# Patient Record
Sex: Female | Born: 1975
Health system: Midwestern US, Community
[De-identification: ages and names within clinical notes are randomized; demographics above are authoritative.]

## PROBLEM LIST (undated history)

## (undated) DIAGNOSIS — N200 Calculus of kidney: Secondary | ICD-10-CM

## (undated) DIAGNOSIS — I10 Essential (primary) hypertension: Secondary | ICD-10-CM

## (undated) DIAGNOSIS — R112 Nausea with vomiting, unspecified: Secondary | ICD-10-CM

## (undated) DIAGNOSIS — Z9889 Other specified postprocedural states: Secondary | ICD-10-CM

## (undated) DIAGNOSIS — Z5189 Encounter for other specified aftercare: Secondary | ICD-10-CM

## (undated) HISTORY — PX: APPENDECTOMY: SHX54

## (undated) HISTORY — PX: ABDOMINAL HYSTERECTOMY: SHX81

---

## 2018-08-15 ENCOUNTER — Encounter (HOSPITAL_COMMUNITY): Payer: Self-pay | Admitting: Emergency Medicine

## 2018-08-15 ENCOUNTER — Other Ambulatory Visit: Payer: Self-pay

## 2018-08-15 ENCOUNTER — Emergency Department (HOSPITAL_COMMUNITY): Payer: Self-pay

## 2018-08-15 ENCOUNTER — Emergency Department (HOSPITAL_COMMUNITY)
Admission: EM | Admit: 2018-08-15 | Discharge: 2018-08-15 | Disposition: A | Discharge status: Home / Self Care | Payer: Self-pay | Attending: Emergency Medicine | Admitting: Emergency Medicine

## 2018-08-15 DIAGNOSIS — R1011 Right upper quadrant pain: Secondary | ICD-10-CM

## 2018-08-15 DIAGNOSIS — N200 Calculus of kidney: Secondary | ICD-10-CM | POA: Insufficient documentation

## 2018-08-15 DIAGNOSIS — Z79899 Other long term (current) drug therapy: Secondary | ICD-10-CM | POA: Insufficient documentation

## 2018-08-15 DIAGNOSIS — N3001 Acute cystitis with hematuria: Secondary | ICD-10-CM | POA: Insufficient documentation

## 2018-08-15 LAB — URINALYSIS, ROUTINE W REFLEX MICROSCOPIC
Bilirubin Urine: NEGATIVE
Glucose, UA: NEGATIVE mg/dL
Ketones, ur: NEGATIVE mg/dL
Nitrite: NEGATIVE
Protein, ur: 100 mg/dL — AB
Specific Gravity, Urine: 1.026 (ref 1.005–1.030)
WBC, UA: >50 WBC/hpf — ABNORMAL HIGH (ref 0–5)
pH: 5 (ref 5.0–8.0)

## 2018-08-15 LAB — COMPREHENSIVE METABOLIC PANEL
ALT: 12 U/L (ref 0–44)
AST: 18 U/L (ref 15–41)
Albumin: 3.6 g/dL (ref 3.5–5.0)
Alkaline Phosphatase: 61 U/L (ref 38–126)
Anion gap: 10 (ref 5–15)
BUN: 20 mg/dL (ref 6–20)
CO2: 29 mmol/L (ref 22–32)
Calcium: 9 mg/dL (ref 8.9–10.3)
Chloride: 105 mmol/L (ref 98–111)
Creatinine, Ser: 1.03 mg/dL — ABNORMAL HIGH (ref 0.44–1.00)
GFR calc non Af Amer: >60 mL/min (ref >60)
Glucose, Bld: 93 mg/dL (ref 70–99)
Potassium: 3.8 mmol/L (ref 3.5–5.1)
SODIUM: 144 mmol/L (ref 135–145)
Total Bilirubin: 0.4 mg/dL (ref 0.3–1.2)
Total Protein: 6.4 g/dL — ABNORMAL LOW (ref 6.5–8.1)

## 2018-08-15 LAB — CBC
HCT: 41.5 % (ref 36.0–46.0)
Hemoglobin: 13.1 g/dL (ref 12.0–15.0)
MCH: 29.6 pg (ref 26.0–34.0)
MCHC: 31.6 g/dL (ref 30.0–36.0)
MCV: 93.7 fL (ref 80.0–100.0)
Platelets: 263 10*3/uL (ref 150–400)
RBC: 4.43 MIL/uL (ref 3.87–5.11)
RDW: 13 % (ref 11.5–15.5)
WBC: 16 10*3/uL — ABNORMAL HIGH (ref 4.0–10.5)
nRBC: 0 % (ref 0.0–0.2)

## 2018-08-15 LAB — LIPASE, BLOOD: Lipase: 42 U/L (ref 11–51)

## 2018-08-15 MED ORDER — CEPHALEXIN 500 MG PO CAPS: 500.0000 mg | ORAL_CAPSULE | Freq: Four times a day (QID) | ORAL | 0 refills | Status: AC

## 2018-08-15 MED ORDER — HYDROCODONE-ACETAMINOPHEN 5-325 MG PO TABS: 1.0000 | ORAL_TABLET | Freq: Four times a day (QID) | ORAL | 0 refills | Status: DC | PRN

## 2018-08-15 MED ORDER — ONDANSETRON HCL 4 MG PO TABS: 4.0000 mg | ORAL_TABLET | Freq: Three times a day (TID) | ORAL | 0 refills | Status: DC | PRN

## 2018-08-15 MED ORDER — SODIUM CHLORIDE 0.9 % IV BOLUS
1000.0000 mL | Freq: Once | INTRAVENOUS | Status: AC
  Administered 2018-08-15: 1000 mL via INTRAVENOUS

## 2018-08-15 MED ORDER — SODIUM CHLORIDE 0.9 % IV SOLN
2.0000 g | Freq: Once | INTRAVENOUS | Status: AC
  Administered 2018-08-15: 2 g via INTRAVENOUS
  Filled 2018-08-15: qty 20

## 2018-08-15 MED ORDER — KETOROLAC TROMETHAMINE 15 MG/ML IJ SOLN
15.0000 mg | Freq: Once | INTRAMUSCULAR | Status: AC
  Administered 2018-08-15: 15 mg via INTRAVENOUS
  Filled 2018-08-15: qty 1

## 2018-08-15 MED ORDER — ONDANSETRON HCL 4 MG/2ML IJ SOLN
4.0000 mg | Freq: Once | INTRAMUSCULAR | Status: AC
  Administered 2018-08-15: 4 mg via INTRAVENOUS
  Filled 2018-08-15: qty 2

## 2018-08-15 NOTE — ED Provider Notes (Signed)
[facility]-EMERGENCY DEPT Provider Note   CSN: [id number] Arrival date & time: 08/15/18  1544     History   Chief Complaint Chief Complaint  Patient presents with  . Abdominal Pain    HPI Monica Boyd is a 42 y.o. female.  HPI 42 year old female here with right upper quadrant pain.  The patient states she was in usual state of health.  She ate lunch.  At around 1:00 PM, 1 hour after eating, she developed acute onset of severe right upper quadrant pain.  The pain is aching, gnawing, and cramp-like.  It radiates towards the back.  She has had associated nausea but no vomiting.  She has a history of kidney stones, with similar symptoms, though she does not have any urinary frequency, urgency, or hematuria at this time.  She also states that the pain is in a somewhat different location than her previous kidney stones, more towards her anterior abdomen.  She otherwise denies any fevers or chills.  No known history of gallstones.  She did eat a fatty biscuit for lunch.  No diarrhea.  History reviewed. No pertinent past medical history.  There are no active problems to display for this patient.   Past Surgical History:  Procedure Laterality Date  . ABDOMINAL HYSTERECTOMY    . APPENDECTOMY       OB History   None      Home Medications    Prior to Admission medications   Medication Sig Start Date End Date Taking? Authorizing Provider  amLODIPine Besylate (NORVASC PO) Take 1 tablet by mouth daily.   Yes [provider]  ibuprofen (ADVIL,MOTRIN) 800 MG tablet Take 1,600 mg by mouth daily as needed for fever or moderate pain.   Yes [provider]  cephALEXin (KEFLEX) 500 MG capsule Take 1 capsule (500 mg total) by mouth 4 (four) times daily for 10 days. 08/15/18 08/25/18  Shaune Pollack, MD  HYDROcodone-acetaminophen (NORCO/VICODIN) 5-325 MG tablet Take 1-2 tablets by mouth every 6 (six) hours as needed for moderate pain or severe pain. 08/15/18    Shaune Pollack, MD  ondansetron (ZOFRAN) 4 MG tablet Take 1 tablet (4 mg total) by mouth every 8 (eight) hours as needed for nausea or vomiting. 08/15/18   Shaune Pollack, MD    Family History No family history on file.  Social History Social History   Tobacco Use  . Smoking status: Not on file  Substance Use Topics  . Alcohol use: Not on file  . Drug use: Not on file     Allergies   Patient has no known allergies.   Review of Systems Review of Systems  Constitutional: Negative for chills, fatigue and fever.  HENT: Negative for congestion and rhinorrhea.   Eyes: Negative for visual disturbance.  Respiratory: Negative for cough, shortness of breath and wheezing.   Cardiovascular: Negative for chest pain and leg swelling.  Gastrointestinal: Positive for abdominal pain and nausea. Negative for diarrhea and vomiting.  Genitourinary: Negative for dysuria and flank pain.  Musculoskeletal: Negative for neck pain and neck stiffness.  Skin: Negative for rash and wound.  Allergic/Immunologic: Negative for immunocompromised state.  Neurological: Negative for syncope, weakness and headaches.  All other systems reviewed and are negative.    Physical Exam Updated Vital Signs BP (!) 143/101   Pulse 69   Temp 97.8 F (36.6 C) (Oral)   Resp 18   Ht 5\' 7"  (1.702 m)   Wt 86.2 kg   SpO2 99%  BMI 29.76 kg/m   Physical Exam  Constitutional: She is oriented to person, place, and time. She appears well-developed and well-nourished. No distress.  HENT:  Head: Normocephalic and atraumatic.  Eyes: Conjunctivae are normal.  Neck: Neck supple.  Cardiovascular: Normal rate, regular rhythm and normal heart sounds. Exam reveals no friction rub.  No murmur heard. Pulmonary/Chest: Effort normal and breath sounds normal. No respiratory distress. She has no wheezes. She has no rales.  Abdominal: She exhibits no distension. There is tenderness in the right upper quadrant. There is  positive Murphy's sign. There is no rigidity, no rebound and no guarding.  Musculoskeletal: She exhibits no edema.  Neurological: She is alert and oriented to person, place, and time. She exhibits normal muscle tone.  Skin: Skin is warm. Capillary refill takes less than 2 seconds.  Psychiatric: She has a normal mood and affect.  Nursing note and vitals reviewed.    ED Treatments / Results  Labs (all labs ordered are listed, but only abnormal results are displayed) Labs Reviewed  COMPREHENSIVE METABOLIC PANEL - Abnormal; Notable for the following components:      Result Value   Creatinine, Ser 1.03 (*)    Total Protein 6.4 (*)    All other components within normal limits  CBC - Abnormal; Notable for the following components:   WBC 16.0 (*)    All other components within normal limits  URINALYSIS, ROUTINE W REFLEX MICROSCOPIC - Abnormal; Notable for the following components:   APPearance CLOUDY (*)    Hgb urine dipstick MODERATE (*)    Protein, ur 100 (*)    Leukocytes, UA LARGE (*)    WBC, UA >50 (*)    Bacteria, UA MANY (*)    All other components within normal limits  URINE CULTURE  LIPASE, BLOOD    EKG None  Radiology [place] Renal  Result Date: 08/15/2018 CLINICAL DATA:  Dysuria right flank pain EXAM: RENAL / URINARY TRACT ULTRASOUND COMPLETE COMPARISON:  None. FINDINGS: Right Kidney: Renal measurements: 12 cm length by 6.7 cm height by 8 cm wide = volume: 336 mL. Cortical echogenicity within normal limits. No hydronephrosis. Diffuse shadowing within the right renal collecting system presumably due to multiple stones. These measure at least 1.6 cm. Left Kidney: Renal measurements: 10.3 cm length by 6.1 cm height by 6.5 cm wide = volume: 214 mL. Echogenicity within normal limits. No mass or hydronephrosis visualized. Bladder: Appears normal for degree of bladder distention. Incidental note made of contracted gallbladder with 1.7 cm echogenic area within the gallbladder lumen.  IMPRESSION: 1. Negative for hydronephrosis. Diffuse shadowing echogenicity in the right renal collecting system suspected to represent multiple stones, measuring at least 1.6 cm. No hydronephrosis. CT KUB could better evaluate. 2. Contracted gallbladder with 1.7 cm intraluminal echogenic focus which may reflect nonshadowing stone or tumefactive sludge. Electronically Signed   By: Jasmine PangKim  Fujinaga M.D.   On: 08/15/2018 21:38   Ct Renal Stone Study  Result Date: 08/15/2018 CLINICAL DATA:  Right flank pain EXAM: CT ABDOMEN AND PELVIS WITHOUT CONTRAST TECHNIQUE: Multidetector CT imaging of the abdomen and pelvis was performed following the standard protocol without IV contrast. COMPARISON:  None. FINDINGS: LOWER CHEST: There is no basilar pleural or apical pericardial effusion. HEPATOBILIARY: The hepatic contours and density are normal. There is no intra- or extrahepatic biliary dilatation. There is cholelithiasis without acute inflammation. PANCREAS: The pancreatic parenchymal contours are normal and there is no ductal dilatation. There is no peripancreatic fluid collection. SPLEEN: Normal. ADRENALS/URINARY  TRACT: --Adrenal glands: Normal. --Right kidney/ureter: There is a massive stone filling the right renal collecting system, measuring up to 4.4 cm. There is mild hydroureter. Mild perinephric stranding. --Left kidney/ureter: No hydronephrosis, nephroureterolithiasis, perinephric stranding or solid renal mass. --Urinary bladder: Normal for degree of distention STOMACH/BOWEL: --Stomach/Duodenum: There is no hiatal hernia or other gastric abnormality. The duodenal course and caliber are normal. --Small bowel: No dilatation or inflammation. --Colon: No focal abnormality. --Appendix: Surgically absent. VASCULAR/LYMPHATIC: Normal course and caliber of the major abdominal vessels. No abdominal or pelvic lymphadenopathy. REPRODUCTIVE: Status post hysterectomy. No adnexal mass. MUSCULOSKELETAL. No bony spinal canal  stenosis or focal osseous abnormality. OTHER: None. IMPRESSION: 1. Massive stone within the right renal collecting system, measuring up to 4.4 cm, with mild hydroureter and mild perinephric stranding. 2. Cholelithiasis without acute inflammation. Electronically Signed   By: Deatra Robinson M.D.   On: 08/15/2018 22:30    Procedures Procedures (including critical care time)  Medications Ordered in ED Medications  ketorolac (TORADOL) 15 MG/ML injection 15 mg (15 mg Intravenous Given 08/15/18 1918)  sodium chloride 0.9 % bolus 1,000 mL (0 mLs Intravenous Stopped 08/15/18 2019)  ondansetron (ZOFRAN) injection 4 mg (4 mg Intravenous Given 08/15/18 1918)  cefTRIAXone (ROCEPHIN) 2 g in sodium chloride 0.9 % 100 mL IVPB (0 g Intravenous Stopped 08/15/18 2317)     Initial Impression / Assessment and Plan / ED Course  I have reviewed the triage vital signs and the nursing notes.  Pertinent labs & imaging results that were available during my care of the patient were reviewed by me and considered in my medical decision making (see chart for details).  Clinical Course as of Aug 16 1  Wed Aug 15, 2018  2049 42 yo F here with abd pain in RUQ. DDx includes stone, pyelo, cholecystitis. Moderate leukocytosis noted but pt is o/w HDS, well appearing without signs of sepsis. IVF started, RUQ and Renal U/S ordered.   [CI]    Clinical Course User Index [CI] Shaune Pollack, MD    RUQ U/S shows renal and GB stones, so CT scan obtained and shows large staghorn calculus of R kidney. She has some mild surrounding stranding which I suspect is etiology for her pain. Will give fluids, IV ABX.   D/w Dr. McDiarmid - given her well appearance and vitals, he recommends d/c and can see pt in office in AM. Pt will be started on ABX, antiemetics, pain meds and will d/c home with very good return precautions including any fever or signs of infection.  Final Clinical Impressions(s) / ED Diagnoses   Final diagnoses:    RUQ pain  Staghorn calculus  Acute cystitis with hematuria    ED Discharge Orders         Ordered    cephALEXin (KEFLEX) 500 MG capsule  4 times daily     08/15/18 2255    ondansetron (ZOFRAN) 4 MG tablet  Every 8 hours PRN     08/15/18 2255    HYDROcodone-acetaminophen (NORCO/VICODIN) 5-325 MG tablet  Every 6 hours PRN     08/15/18 2255           Shaune Pollack, MD 08/16/18 0002

## 2018-08-15 NOTE — Discharge Instructions (Addendum)
Take the antibiotics as prescribed  Do not take any Ibuprofen, Motrin, or other NSAIDs until told you can take this by the Urologist  Call the [facility] office (number above) in the morning and notify them that you were seen in [facility] today, and that Dr. Perley JainMcDiarmid has requested that you are seen same-day tomorrow.   Return to the ER with worsening pain, intractable vomiting, fever, or other worrisome symptoms

## 2018-08-15 NOTE — ED Triage Notes (Signed)
Patient c/o right abdominal pain intermittently x1 month. Denies N/V/D. Reports pain is relieved with ibuprofen. Reports intermittent dysuria. States pain occassionally radiates to right back.

## 2018-08-19 LAB — URINE CULTURE
Culture: >=100000 — AB
Special Requests: NORMAL

## 2018-08-20 ENCOUNTER — Telehealth (HOSPITAL_COMMUNITY): Payer: Self-pay | Admitting: Pharmacist

## 2018-08-20 ENCOUNTER — Telehealth: Payer: Self-pay | Admitting: Emergency Medicine

## 2018-08-20 NOTE — Progress Notes (Signed)
ED Antimicrobial Stewardship Positive Culture Follow Up   Monica Boyd is an 42 y.o. female who [facility] on (Not on file) with a chief complaint of No chief complaint on file.   Recent Results (from the past 720 hour(s))  Urine culture     Status: Abnormal   Collection Time: 08/15/18  3:59 PM  Result Value Ref Range Status   Specimen Description   Final    URINE, CLEAN CATCH Performed at [facility], [address] [place], [place] [address]    Special Requests   Final    Normal Performed at [facility], [address] [place], [place] [address]    Culture >=100,000 COLONIES/mL ENTEROCOCCUS FAECALIS (A)  Final   Report Status 08/19/2018 FINAL  Final   Organism ID, Bacteria ENTEROCOCCUS FAECALIS (A)  Final      Susceptibility   Enterococcus faecalis - MIC*    AMPICILLIN <=2 SENSITIVE Sensitive     LEVOFLOXACIN 1 SENSITIVE Sensitive     NITROFURANTOIN <=16 SENSITIVE Sensitive     VANCOMYCIN 1 SENSITIVE Sensitive     * >=100,000 COLONIES/mL ENTEROCOCCUS FAECALIS    [x]  Treated with Cephalexin, organism resistant to prescribed antimicrobial []  Patient discharged originally without antimicrobial agent and treatment is now indicated  New antibiotic prescription: Amoxicillin 500 mg po bid x 7 days  ED Provider: Mamie LaurelJ. Hedges, PA-C   MastersDarl Boyd, Monica Boyd 08/20/2018, 9:03 AM Clinical Pharmacist Monday - Friday phone -  [phone] Saturday - Sunday phone - [phone]

## 2018-08-20 NOTE — Telephone Encounter (Signed)
Post ED Visit - Positive Culture Follow-up: Successful Patient Follow-Up  Culture assessed and recommendations reviewed by:  []  Enzo BiNathan Batchelder, Pharm.D. []  Celedonio MiyamotoJeremy Frens, Pharm.D., BCPS AQ-ID []  Garvin FilaMike Maccia, Pharm.D., BCPS []  Georgina PillionElizabeth Martin, Pharm.D., BCPS []  PelicanMinh Pham, [address]Ph[address]D., BCPS, AAHIVP []  Estella HuskMichelle Turner, Pharm.D., BCPS, AAHIVP []  Lysle Pearlachel Rumbarger, PharmD, BCPS []  Phillips Climeshuy Dang, PharmD, BCPS [x]  Agapito GamesAlison Masters, PharmD, BCPS []  Verlan FriendsErin Deja, PharmD  Positive urine culture  []  Patient discharged without antimicrobial prescription and treatment is now indicated [x]  Organism is resistant to prescribed ED discharge antimicrobial []  Patient with positive blood cultures  Changes discussed with ED provider: Eyvonne MechanicJeffrey Hedges PA New antibiotic prescription: Amoxicillin 500 mg PO BID x seven days [facility] to [facility] [place] [phone].  Contacted patient, date 08/20/18, time 1109   Norm ParcelShannon Christin Mccreedy 08/20/2018, 11:11 AM

## 2018-08-23 ENCOUNTER — Other Ambulatory Visit: Payer: Self-pay | Admitting: Urology

## 2018-08-23 DIAGNOSIS — N200 Calculus of kidney: Secondary | ICD-10-CM

## 2018-09-04 ENCOUNTER — Ambulatory Visit (HOSPITAL_COMMUNITY): Payer: Self-pay

## 2018-10-01 ENCOUNTER — Other Ambulatory Visit: Payer: Self-pay | Admitting: Urology

## 2018-10-04 ENCOUNTER — Other Ambulatory Visit (HOSPITAL_COMMUNITY): Payer: Self-pay | Admitting: Urology

## 2018-10-04 DIAGNOSIS — N200 Calculus of kidney: Secondary | ICD-10-CM

## 2018-10-05 ENCOUNTER — Other Ambulatory Visit (HOSPITAL_COMMUNITY): Payer: Self-pay

## 2018-10-05 ENCOUNTER — Encounter (HOSPITAL_COMMUNITY): Payer: Self-pay

## 2018-10-05 ENCOUNTER — Ambulatory Visit: Admit: 2018-10-05 | Payer: Self-pay | Admitting: Urology

## 2018-10-05 SURGERY — NEPHROLITHOTOMY PERCUTANEOUS
Anesthesia: General | Laterality: Right

## 2018-10-16 ENCOUNTER — Encounter (HOSPITAL_COMMUNITY)
Admission: RE | Admit: 2018-10-16 | Discharge: 2018-10-16 | Disposition: A | Discharge status: Home / Self Care | Payer: Self-pay | Source: Ambulatory Visit | Attending: Urology | Admitting: Urology

## 2018-10-16 ENCOUNTER — Other Ambulatory Visit: Payer: Self-pay

## 2018-10-16 ENCOUNTER — Encounter (HOSPITAL_COMMUNITY): Payer: Self-pay

## 2018-10-16 DIAGNOSIS — N200 Calculus of kidney: Secondary | ICD-10-CM | POA: Insufficient documentation

## 2018-10-16 DIAGNOSIS — I10 Essential (primary) hypertension: Secondary | ICD-10-CM | POA: Insufficient documentation

## 2018-10-16 DIAGNOSIS — Z01818 Encounter for other preprocedural examination: Secondary | ICD-10-CM | POA: Insufficient documentation

## 2018-10-16 HISTORY — DX: Other specified postprocedural states: R11.2

## 2018-10-16 HISTORY — DX: Calculus of kidney: N20.0

## 2018-10-16 HISTORY — DX: Nausea with vomiting, unspecified: Z98.890

## 2018-10-16 HISTORY — DX: Encounter for other specified aftercare: Z51.89

## 2018-10-16 HISTORY — DX: Essential (primary) hypertension: I10

## 2018-10-16 LAB — BASIC METABOLIC PANEL
ANION GAP: 9 (ref 5–15)
BUN: 34 mg/dL — ABNORMAL HIGH (ref 6–20)
CO2: 25 mmol/L (ref 22–32)
Calcium: 9.7 mg/dL (ref 8.9–10.3)
Chloride: 108 mmol/L (ref 98–111)
Creatinine, Ser: 1.18 mg/dL — ABNORMAL HIGH (ref 0.44–1.00)
GFR calc Af Amer: >60 mL/min (ref >60)
GFR calc non Af Amer: 56 mL/min — ABNORMAL LOW (ref >60)
Glucose, Bld: 97 mg/dL (ref 70–99)
Potassium: 3.7 mmol/L (ref 3.5–5.1)
Sodium: 142 mmol/L (ref 135–145)

## 2018-10-16 LAB — CBC
HCT: 39.7 % (ref 36.0–46.0)
Hemoglobin: 12.4 g/dL (ref 12.0–15.0)
MCH: 29.2 pg (ref 26.0–34.0)
MCHC: 31.2 g/dL (ref 30.0–36.0)
MCV: 93.4 fL (ref 80.0–100.0)
NRBC: 0 % (ref 0.0–0.2)
Platelets: 284 10*3/uL (ref 150–400)
RBC: 4.25 MIL/uL (ref 3.87–5.11)
RDW: 14.6 % (ref 11.5–15.5)
WBC: 7.8 10*3/uL (ref 4.0–10.5)

## 2018-10-16 LAB — ABO/RH: ABO/RH(D): A POS

## 2018-10-16 NOTE — Anesthesia Preprocedure Evaluation (Addendum)
Anesthesia Evaluation  Patient identified by MRN, date of birth, ID band Patient awake    Reviewed: Allergy & Precautions, NPO status , Patient's Chart, lab work & pertinent test results  History of Anesthesia Complications (+) PONV and history of anesthetic complications  Airway Mallampati: I       Dental no notable dental hx. (+) Teeth Intact   Pulmonary neg pulmonary ROS,    Pulmonary exam normal breath sounds clear to auscultation       Cardiovascular hypertension, Pt. on medications Normal cardiovascular exam Rhythm:Regular Rate:Normal     Neuro/Psych negative neurological ROS  negative psych ROS   GI/Hepatic negative GI ROS, Neg liver ROS,   Endo/Other  negative endocrine ROS  Renal/GU Renal InsufficiencyRenal disease  negative genitourinary   Musculoskeletal negative musculoskeletal ROS (+)   Abdominal Normal abdominal exam  (+)   Peds  Hematology negative hematology ROS (+)   Anesthesia Other Findings   Reproductive/Obstetrics                            Anesthesia Physical Anesthesia Plan  ASA: II  Anesthesia Plan: General   Post-op Pain Management:    Induction: Intravenous  PONV Risk Score and Plan: 3 and Ondansetron, Dexamethasone and Scopolamine patch - Pre-op  Airway Management Planned: Oral ETT  Additional Equipment:   Intra-op Plan:   Post-operative Plan: Extubation in OR  Informed Consent: I have reviewed the patients History and Physical, chart, labs and discussed the procedure including the risks, benefits and alternatives for the proposed anesthesia with the patient or authorized representative who has indicated his/her understanding and acceptance.     Dental advisory given  Plan Discussed with:   Anesthesia Plan Comments: (See PST note 10/16/18, Jodell Cipro, PA-C)      Anesthesia Quick Evaluation

## 2018-10-16 NOTE — Progress Notes (Signed)
Anesthesia Chart Review   Case:  [id number] Date/Time:  10/19/18 1245   Procedure:  NEPHROLITHOTOMY PERCUTANEOUS (Right )   Anesthesia type:  General   Pre-op diagnosis:  RIGHT STAGHORN STONE   Location:  [facility] ROOM 03 / [facility]   Surgeon:  Jerilee Field, MD      DISCUSSION: 43 yo never smoker with h/o PONV, HTN, right staghorn stone scheduled for above procedure 10/19/18 with Dr. Jerilee Field.   Elevated BP at PST visit 10/16/2018.  Pt advised to contact PCP for optimization of HTN prior to surgery.  She reports she does note take BP at home.  She is asymptomatic at PST with EKG NSR.  She is taking amlodipine and did take her medication this morning.  She will contact PCP today.    Surgeon made aware.  VS: BP (!) 146/106   Pulse (!) 110   Temp 37 C (Oral)   Resp 20   SpO2 97%   PROVIDERS: Patient, No Pcp Per   LABS: Labs reviewed: Acceptable for surgery. (all labs ordered are listed, but only abnormal results are displayed)  Labs Reviewed  BASIC METABOLIC PANEL - Abnormal; Notable for the following components:      Result Value   BUN 34 (*)    Creatinine, Ser 1.18 (*)    GFR calc non Af Amer 56 (*)    All other components within normal limits  CBC  TYPE AND SCREEN  ABO/RH     IMAGES:   EKG: 10/16/2018 Rate 85 bpm Normal sinus rhythm  Normal ECG   CV:  Past Medical History:  Diagnosis Date  . Blood transfusion without reported diagnosis    no reaction per patient   . HTN (hypertension)   . Kidney stone   . PONV (postoperative nausea and vomiting)     Past Surgical History:  Procedure Laterality Date  . ABDOMINAL HYSTERECTOMY    . APPENDECTOMY    . CESAREAN SECTION      MEDICATIONS: . cephALEXin (KEFLEX) 500 MG capsule  . acetaminophen (TYLENOL) 500 MG tablet  . amLODipine (NORVASC) 5 MG tablet  . HYDROcodone-acetaminophen (NORCO/VICODIN) 5-325 MG tablet  . ibuprofen (ADVIL,MOTRIN) 200 MG tablet  . ondansetron (ZOFRAN) 4 MG tablet   No  current facility-administered medications for this encounter.     Janey Genta [facility] Pre-Surgical Testing [phone] 10/16/18 12:09 PM

## 2018-10-16 NOTE — Patient Instructions (Signed)
Monica Boyd  10/16/2018   Your procedure is scheduled on: 10-19-2018   [facility]    Report to RADIOLOGY DEPARTMENT  at 8:00AM    Call this number if you have problems the morning of surgery [phone]      Remember: Do not eat food After Midnight. YOU MAY HAVE CLEAR LIQUIDS FROM MIDNIGHT UNTIL 7:00AM. NOTHING BY MOUTH AFTER 7:00AM! BRUSH YOUR TEETH MORNING OF SURGERY AND RINSE YOUR MOUTH OUT, NO CHEWING GUM CANDY OR MINTS.     CLEAR LIQUID DIET   Foods Allowed                                                                     Foods Excluded  Coffee and tea, regular and decaf                             liquids that you cannot  Plain Jell-O in any flavor                                             see through such as: Fruit ices (not with fruit pulp)                                     milk, soups, orange juice  Iced Popsicles                                    All solid food Carbonated beverages, regular and diet                                    Cranberry, grape and apple juices Sports drinks like Gatorade Lightly seasoned clear broth or consume(fat free) Sugar, honey syrup  Sample Menu Breakfast                                Lunch                                     Supper Cranberry juice                    Beef broth                            Chicken broth Jell-O                                     Grape juice  Apple juice Coffee or tea                        Jell-O                                      Popsicle                                                Coffee or tea                        Coffee or tea  _____________________________________________________________________       Take these medicines the morning of surgery with A SIP OF WATER: AMLODIPINE, TYLENOL IF NEEDED                                You may not have any metal on your body including hair pins and   piercings  Do not wear jewelry, make-up, lotions, powders or perfumes, deodorant             Do not wear nail polish.  Do not shave  48 hours prior to surgery.                Do not bring valuables to the hospital. [facility] IS NOT             RESPONSIBLE   FOR VALUABLES.  Contacts, dentures or bridgework may not be worn into surgery.  Leave suitcase in the car. After surgery it may be brought to your room.     Patients discharged the day of surgery will not be allowed to drive home. IF YOU ARE HAVING SURGERY AND GOING HOME THE SAME DAY, YOU MUST HAVE AN ADULT TO DRIVE YOU HOME AND BE WITH YOU FOR 24 HOURS. YOU MAY GO HOME BY TAXI OR UBER OR ORTHERWISE, BUT AN ADULT MUST ACCOMPANY YOU HOME AND STAY WITH YOU FOR 24 HOURS.  Name and phone number of your driver:  Special Instructions: N/A              Please read over the following fact sheets you were given: _____________________________________________________________________             [facility] - Preparing for Surgery Before surgery, you can play an important role.  Because skin is not sterile, your skin needs to be as free of germs as possible.  You can reduce the number of germs on your skin by washing with CHG (chlorahexidine gluconate) soap before surgery.  CHG is an antiseptic cleaner which kills germs and bonds with the skin to continue killing germs even after washing. Please DO NOT use if you have an allergy to CHG or antibacterial soaps.  If your skin becomes reddened/irritated stop using the CHG and inform your nurse when you arrive at Short Stay. Do not shave (including legs and underarms) for at least 48 hours prior to the first CHG shower.  You may shave your face/neck. Please follow these instructions carefully:  1.  Shower with CHG Soap the night before surgery and the  morning of Surgery.  2.  If you choose to wash your hair, wash  your hair first as usual with your  normal  shampoo.  3.  After you shampoo, rinse your  hair and body thoroughly to remove the  shampoo.                           4.  Use CHG as you would any other liquid soap.  You can apply chg directly  to the skin and wash                       Gently with a scrungie or clean washcloth.  5.  Apply the CHG Soap to your body ONLY FROM THE NECK DOWN.   Do not use on face/ open                           Wound or open sores. Avoid contact with eyes, ears mouth and genitals (private parts).                       Wash face,  Genitals (private parts) with your normal soap.             6.  Wash thoroughly, paying special attention to the area where your surgery  will be performed.  7.  Thoroughly rinse your body with warm water from the neck down.  8.  DO NOT shower/wash with your normal soap after using and rinsing off  the CHG Soap.                9.  Pat yourself dry with a clean towel.            10.  Wear clean pajamas.            11.  Place clean sheets on your bed the night of your first shower and do not  sleep with pets. Day of Surgery : Do not apply any lotions/deodorants the morning of surgery.  Please wear clean clothes to the hospital/surgery center.  FAILURE TO FOLLOW THESE INSTRUCTIONS MAY RESULT IN THE CANCELLATION OF YOUR SURGERY PATIENT SIGNATURE_________________________________  NURSE SIGNATURE__________________________________  ________________________________________________________________________    WHAT IS A BLOOD TRANSFUSION? Blood Transfusion Information  A transfusion is the replacement of blood or some of its parts. Blood is made up of multiple cells which provide different functions.  Red blood cells carry oxygen and are used for blood loss replacement.  White blood cells fight against infection.  Platelets control bleeding.  Plasma helps clot blood.  Other blood products are available for specialized needs, such as hemophilia or other clotting disorders. BEFORE THE TRANSFUSION  Who gives blood for  transfusions?   Healthy volunteers who are fully evaluated to make sure their blood is safe. This is blood bank blood. Transfusion therapy is the safest it has ever been in the practice of medicine. Before blood is taken from a donor, a complete history is taken to make sure that person has no history of diseases nor engages in risky social behavior (examples are intravenous drug use or sexual activity with multiple partners). The donor's travel history is screened to minimize risk of transmitting infections, such as malaria. The donated blood is tested for signs of infectious diseases, such as HIV and hepatitis. The blood is then tested to be sure it is compatible with you in order to minimize the chance of a transfusion reaction. If you or a relative  donates blood, this is often done in anticipation of surgery and is not appropriate for emergency situations. It takes many days to process the donated blood. RISKS AND COMPLICATIONS Although transfusion therapy is very safe and saves many lives, the main dangers of transfusion include:   Getting an infectious disease.  Developing a transfusion reaction. This is an allergic reaction to something in the blood you were given. Every precaution is taken to prevent this. The decision to have a blood transfusion has been considered carefully by your caregiver before blood is given. Blood is not given unless the benefits outweigh the risks. AFTER THE TRANSFUSION  Right after receiving a blood transfusion, you will usually feel much better and more energetic. This is especially true if your red blood cells have gotten low (anemic). The transfusion raises the level of the red blood cells which carry oxygen, and this usually causes an energy increase.  The nurse administering the transfusion will monitor you carefully for complications. HOME CARE INSTRUCTIONS  No special instructions are needed after a transfusion. You may find your energy is better. Speak with  your caregiver about any limitations on activity for underlying diseases you may have. SEEK MEDICAL CARE IF:   Your condition is not improving after your transfusion.  You develop redness or irritation at the intravenous (IV) site. SEEK IMMEDIATE MEDICAL CARE IF:  Any of the following symptoms occur over the next 12 hours:  Shaking chills.  You have a temperature by mouth above 102 F (38.9 C), not controlled by medicine.  Chest, back, or muscle pain.  People around you feel you are not acting correctly or are confused.  Shortness of breath or difficulty breathing.  Dizziness and fainting.  You get a rash or develop hives.  You have a decrease in urine output.  Your urine turns a dark color or changes to pink, red, or brown. Any of the following symptoms occur over the next 10 days:  You have a temperature by mouth above 102 F (38.9 C), not controlled by medicine.  Shortness of breath.  Weakness after normal activity.  The white part of the eye turns yellow (jaundice).  You have a decrease in the amount of urine or are urinating less often.  Your urine turns a dark color or changes to pink, red, or brown. Document Released: 08/19/2000 Document Revised: 11/14/2011 Document Reviewed: 04/07/2008 [facility] Patient Information 2014 [place], [place].  _______________________________________________________________________

## 2018-10-17 ENCOUNTER — Inpatient Hospital Stay (HOSPITAL_COMMUNITY): Admission: RE | Admit: 2018-10-17 | Payer: Self-pay | Source: Ambulatory Visit

## 2018-10-17 NOTE — H&P (Signed)
Office Visit Report     10/15/2018   --------------------------------------------------------------------------------   [name]  MRN: [id number]  PRIMARY CARE:    DOB: May 22, 1976, 43 year old Female  REFERRING:  Shaune Pollack, MD  SSN:   PROVIDER:  Jerilee Field, M.D.    TREATING:  Monica Boyd    LOCATION:  [facility] [phone]   --------------------------------------------------------------------------------   CC: I have kidney stones.  HPI: Monica Boyd is a 43 year-old female established patient who is here for renal calculi.  December 2019: She developed RUQ pain. 08/15/2018 US showed a large right stone and no hydro. 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 WBC was 16, cr 1.02. UA with many bacteria, Cx pending. Hgb 13.1. Pain is manageable. No pain meds needed today.   H/o HTN on Norvasc, but she doesn't take it often. She has had "UTI" for several years. She has frequency, dysuria and pressure.    10/15/18: She returns today for pre operative assessment. She is scheduled for right PCN on 2/14. She works out of state in [place] and presented to local ED with complaints of progressive pain and fevers. She underwent right ureteral stent placement was placed on antibiotic therapy. She remains on Keflex 500 mg QID currently, but will complete this course of therapy over the next few days. She denies any significant flank pain today, but does have some discomfort intermittently. Her most bothersome complaint is irritative lower urinary tract symptoms including: frequency, urgency, and intermittent hematuria. She denies difficulties voiding. She denies fever, chills, nausea, or vomiting. She denies problems with anesthesia in the past. No past cardiac hx. She is not on blood thinners.   The problem is on the right side. This is not her first kidney stone. She has had 1 stones prior to getting this one. She is  currently having flank pain. She denies having back pain, groin pain, nausea, vomiting, fever, and chills. She has not caught a stone in her urine strainer since her symptoms began.   She has had ureteral stent for treatment of her stones in the past.     ALLERGIES: None   MEDICATIONS: Fluconazole 150 mg tablet Take 1 tablet PO one time  Hydrocodone-Acetaminophen 5 mg-325 mg tablet 1 tablet PO Q 6 H PRN  Ibu 800 mg tablet  Norvasc     GU PSH: None     PSH Notes: C section   NON-GU PSH: Hysterectomy    GU PMH: Renal calculus - 08/16/2018    NON-GU PMH: Hypertension    FAMILY HISTORY: Essential Hypertension - Mother, Sister, Grandmother   SOCIAL HISTORY: Marital Status: Unknown Preferred Language: English; Ethnicity: Not Hispanic Or Latino; Race: White Current Smoking Status: Patient has never smoked.   Tobacco Use Assessment Completed: Used Tobacco in last 30 days? Has never drank.  Drinks 2 caffeinated drinks per day. Patient's occupation Automotive engineer.    REVIEW OF SYSTEMS:    GU Review Female:   Patient reports frequent urination, hard to postpone urination, and burning /pain with urination. Patient denies get up at night to urinate, leakage of urine, stream starts and stops, trouble starting your stream, have to strain to urinate, and being pregnant.  Gastrointestinal (Upper):   Patient denies nausea, vomiting, and indigestion/ heartburn.  Gastrointestinal (Lower):   Patient denies diarrhea and constipation.  Constitutional:   Patient denies fever, night sweats, weight loss, and fatigue.  Skin:  Patient denies skin rash/ lesion and itching.  Eyes:   Patient denies blurred vision and double vision.  Ears/ Nose/ Throat:   Patient denies sore throat and sinus problems.  Hematologic/Lymphatic:   Patient denies swollen glands and easy bruising.  Cardiovascular:   Patient denies leg swelling and chest pains.  Respiratory:   Patient denies cough and shortness of  breath.  Endocrine:   Patient denies excessive thirst.  Musculoskeletal:   Patient denies back pain and joint pain.  Neurological:   Patient denies headaches and dizziness.  Psychologic:   Patient denies depression and anxiety.   VITAL SIGNS:      10/15/2018 03:16 PM  Weight 195 lb / 88.45 kg  Height 67 in / 170.18 cm  BP 134/90 mmHg  Pulse 88 /min  Temperature 98.2 F / 36.7 C  BMI 30.5 kg/m   MULTI-SYSTEM PHYSICAL EXAMINATION:    Constitutional: Well-nourished. No physical deformities. Normally developed. Good grooming.  Respiratory: Normal breath sounds. No labored breathing, no use of accessory muscles.   Cardiovascular: Regular rate and rhythm. No murmur, no gallop. Normal temperature, normal extremity pulses, no swelling, no varicosities.   Neurologic / Psychiatric: Oriented to time, oriented to place, oriented to person. No depression, no anxiety, no agitation.  Gastrointestinal: No mass, no tenderness, no rigidity, non obese abdomen. Mild right CVAT.   Musculoskeletal: Normal gait and station of head and neck.     PAST DATA REVIEWED:  Source Of History:  Patient  Records Review:   Previous Patient Records  Urine Test Review:   Urinalysis, Urine Culture  X-Ray Review: C.T. Abdomen/Pelvis: Reviewed Films. Reviewed Report.     PROCEDURES:          Urinalysis w/Scope Dipstick Dipstick Cont'd Micro  Color: Amber Bilirubin: Neg mg/dL WBC/hpf: 20 - 09/WJX40/hpf  Appearance: Cloudy Ketones: Neg mg/dL RBC/hpf: >91/YNW>60/hpf  Specific Gravity: 1.020 Blood: 3+ ery/uL Bacteria: Few (10-25/hpf)  pH: 6.0 Protein: 3+ mg/dL Cystals: NS (Not Seen)  Glucose: Neg mg/dL Urobilinogen: 0.2 mg/dL Casts: NS (Not Seen)    Nitrites: Neg Trichomonas: Not Present    Leukocyte Esterase: 3+ leu/uL Mucous: Not Present      Epithelial Cells: 0 - 5/hpf      Yeast: NS (Not Seen)      Sperm: Not Present    Notes: qns to spin    ASSESSMENT:      ICD-10 Details  1 GU:   Renal calculus - N20.0    PLAN:            Orders Labs Urine Culture          Document Letter(s):  Created for Patient: Clinical Summary         Notes:   Urinalysis today shows pyuria, hematuria, and bacteriuria. I will repeat urine culture given upcoming procedure. She will complete current dosage of Keflex, as previously prescribed. I will keep her informed of culture results and let her know if we need to consider extending coverage or place her on alternative therapy prior to her scheduled procedure. We again reviewed right PCNL and I answered all of her questions to the best of my ability. She will follow up, as planned, for scheduled procedure. Return precuations reviewed in the interim.         Next Appointment:      Next Appointment: 10/19/2018 01:00 PM    Appointment Type: Surgery     Location: [facility] [phone]    Provider: Jerilee FieldMatthew Dynisha Due,  M.D.    Reason for Visit: [facility]/EXT REC RIGHT PCNL      * Signed by Monica BullsBree Fleck on 10/15/18 at 4:17 PM (EST*     The information contained in this medical record document is considered private and confidential patient information. This information can only be used for the medical diagnosis and/or medical services that are being provided by the patient's selected caregivers. This information can only be distributed outside of the patient's care if the patient agrees and signs waivers of authorization for this information to be sent to an outside source or route.  Addendum -- 2/10 urine cx pending. I sent more cephalexin in case she runs out.

## 2018-10-18 ENCOUNTER — Other Ambulatory Visit: Payer: Self-pay | Admitting: Radiology

## 2018-10-19 ENCOUNTER — Encounter (HOSPITAL_COMMUNITY): Payer: Self-pay | Admitting: *Deleted

## 2018-10-19 ENCOUNTER — Ambulatory Visit (HOSPITAL_COMMUNITY)
Admission: RE | Admit: 2018-10-19 | Discharge: 2018-10-19 | Disposition: A | Discharge status: Home / Self Care | Payer: Self-pay | Source: Ambulatory Visit | Attending: Urology | Admitting: Urology

## 2018-10-19 ENCOUNTER — Ambulatory Visit (HOSPITAL_COMMUNITY): Payer: Self-pay | Admitting: Anesthesiology

## 2018-10-19 ENCOUNTER — Observation Stay (HOSPITAL_COMMUNITY)
Admission: RE | Admit: 2018-10-19 | Discharge: 2018-10-21 | Disposition: A | Discharge status: Home / Self Care | Payer: Self-pay | Source: Ambulatory Visit | Attending: Urology | Admitting: Urology

## 2018-10-19 ENCOUNTER — Other Ambulatory Visit: Payer: Self-pay

## 2018-10-19 ENCOUNTER — Encounter (HOSPITAL_COMMUNITY)
Admission: RE | Disposition: A | Discharge status: Home / Self Care | Payer: Self-pay | Source: Ambulatory Visit | Attending: Urology

## 2018-10-19 ENCOUNTER — Ambulatory Visit (HOSPITAL_COMMUNITY): Payer: Self-pay | Admitting: Physician Assistant

## 2018-10-19 ENCOUNTER — Ambulatory Visit (HOSPITAL_COMMUNITY): Payer: Self-pay

## 2018-10-19 DIAGNOSIS — N200 Calculus of kidney: Secondary | ICD-10-CM

## 2018-10-19 DIAGNOSIS — R071 Chest pain on breathing: Secondary | ICD-10-CM

## 2018-10-19 DIAGNOSIS — Z87440 Personal history of urinary (tract) infections: Secondary | ICD-10-CM | POA: Insufficient documentation

## 2018-10-19 DIAGNOSIS — I10 Essential (primary) hypertension: Secondary | ICD-10-CM | POA: Insufficient documentation

## 2018-10-19 DIAGNOSIS — Z79899 Other long term (current) drug therapy: Secondary | ICD-10-CM | POA: Insufficient documentation

## 2018-10-19 DIAGNOSIS — Z791 Long term (current) use of non-steroidal anti-inflammatories (NSAID): Secondary | ICD-10-CM | POA: Insufficient documentation

## 2018-10-19 DIAGNOSIS — Z87442 Personal history of urinary calculi: Secondary | ICD-10-CM | POA: Insufficient documentation

## 2018-10-19 DIAGNOSIS — Z90710 Acquired absence of both cervix and uterus: Secondary | ICD-10-CM | POA: Insufficient documentation

## 2018-10-19 HISTORY — PX: NEPHROLITHOTOMY: SHX5134

## 2018-10-19 HISTORY — PX: IR URETERAL STENT RIGHT NEW ACCESS W/O SEP NEPHROSTOMY CATH: IMG6076

## 2018-10-19 LAB — BASIC METABOLIC PANEL
Anion gap: 9 (ref 5–15)
Anion gap: 6 (ref 5–15)
BUN: 29 mg/dL — ABNORMAL HIGH (ref 6–20)
BUN: 25 mg/dL — ABNORMAL HIGH (ref 6–20)
CO2: 23 mmol/L (ref 22–32)
CO2: 25 mmol/L (ref 22–32)
Calcium: 9.2 mg/dL (ref 8.9–10.3)
Calcium: 8.4 mg/dL — ABNORMAL LOW (ref 8.9–10.3)
Chloride: 109 mmol/L (ref 98–111)
Chloride: 110 mmol/L (ref 98–111)
Creatinine, Ser: 1.1 mg/dL — ABNORMAL HIGH (ref 0.44–1.00)
Creatinine, Ser: 0.99 mg/dL (ref 0.44–1.00)
GFR calc Af Amer: >60 mL/min (ref >60)
GFR calc Af Amer: >60 mL/min (ref >60)
GFR calc non Af Amer: >60 mL/min (ref >60)
GFR calc non Af Amer: >60 mL/min (ref >60)
Glucose, Bld: 99 mg/dL (ref 70–99)
Glucose, Bld: 124 mg/dL — ABNORMAL HIGH (ref 70–99)
Potassium: 3.7 mmol/L (ref 3.5–5.1)
Potassium: 3.4 mmol/L — ABNORMAL LOW (ref 3.5–5.1)
SODIUM: 141 mmol/L (ref 135–145)
Sodium: 141 mmol/L (ref 135–145)

## 2018-10-19 LAB — PROTIME-INR
INR: 0.94
Prothrombin Time: 12.5 seconds (ref 11.4–15.2)

## 2018-10-19 LAB — TYPE AND SCREEN
ABO/RH(D): A POS
Antibody Screen: NEGATIVE

## 2018-10-19 LAB — CBC WITH DIFFERENTIAL/PLATELET
Abs Immature Granulocytes: 0.02 10*3/uL (ref 0.00–0.07)
Basophils Absolute: 0.1 10*3/uL (ref 0.0–0.1)
Basophils Relative: 1 %
EOS ABS: 0.3 10*3/uL (ref 0.0–0.5)
Eosinophils Relative: 5 %
HCT: 39.4 % (ref 36.0–46.0)
Hemoglobin: 12.5 g/dL (ref 12.0–15.0)
Immature Granulocytes: 0 %
Lymphocytes Relative: 26 %
Lymphs Abs: 1.8 10*3/uL (ref 0.7–4.0)
MCH: 29.5 pg (ref 26.0–34.0)
MCHC: 31.7 g/dL (ref 30.0–36.0)
MCV: 92.9 fL (ref 80.0–100.0)
Monocytes Absolute: 0.5 10*3/uL (ref 0.1–1.0)
Monocytes Relative: 7 %
Neutro Abs: 4.2 10*3/uL (ref 1.7–7.7)
Neutrophils Relative %: 61 %
Platelets: 297 10*3/uL (ref 150–400)
RBC: 4.24 MIL/uL (ref 3.87–5.11)
RDW: 14.4 % (ref 11.5–15.5)
WBC: 6.8 10*3/uL (ref 4.0–10.5)
nRBC: 0 % (ref 0.0–0.2)

## 2018-10-19 LAB — HEMOGLOBIN AND HEMATOCRIT, BLOOD
HCT: 38.8 % (ref 36.0–46.0)
Hemoglobin: 11.9 g/dL — ABNORMAL LOW (ref 12.0–15.0)

## 2018-10-19 SURGERY — NEPHROLITHOTOMY PERCUTANEOUS
Anesthesia: General | Laterality: Right

## 2018-10-19 MED ORDER — ACETAMINOPHEN 325 MG PO TABS: 650.0000 mg | ORAL_TABLET | ORAL | Status: DC | PRN

## 2018-10-19 MED ORDER — SODIUM CHLORIDE 0.9 % IV SOLN: INTRAVENOUS | Status: DC

## 2018-10-19 MED ORDER — TAMSULOSIN HCL 0.4 MG PO CAPS: 0.4000 mg | ORAL_CAPSULE | Freq: Every day | ORAL | 0 refills | Status: AC

## 2018-10-19 MED ORDER — ONDANSETRON HCL 4 MG/2ML IJ SOLN
INTRAMUSCULAR | Status: AC
  Filled 2018-10-19: qty 2

## 2018-10-19 MED ORDER — CEFAZOLIN SODIUM-DEXTROSE 2-4 GM/100ML-% IV SOLN
INTRAVENOUS | Status: AC
  Filled 2018-10-19: qty 100

## 2018-10-19 MED ORDER — HYDROMORPHONE HCL 1 MG/ML IJ SOLN
INTRAMUSCULAR | Status: AC
  Administered 2018-10-19: 0.5 mg via INTRAVENOUS
  Filled 2018-10-19: qty 1

## 2018-10-19 MED ORDER — TAMSULOSIN HCL 0.4 MG PO CAPS
0.4000 mg | ORAL_CAPSULE | Freq: Every day | ORAL | Status: DC
  Administered 2018-10-19 – 2018-10-20 (×2): 0.4 mg via ORAL
  Filled 2018-10-19 (×2): qty 1

## 2018-10-19 MED ORDER — MIDAZOLAM HCL 2 MG/2ML IJ SOLN: 2.0000 mg | Freq: Once | INTRAMUSCULAR | Status: DC

## 2018-10-19 MED ORDER — LIDOCAINE HCL 1 % IJ SOLN
INTRAMUSCULAR | Status: AC
  Filled 2018-10-19: qty 20

## 2018-10-19 MED ORDER — MIDAZOLAM HCL 2 MG/2ML IJ SOLN
INTRAMUSCULAR | Status: AC | PRN
  Administered 2018-10-19 (×3): 1 mg via INTRAVENOUS

## 2018-10-19 MED ORDER — ACETAMINOPHEN 10 MG/ML IV SOLN
INTRAVENOUS | Status: AC
  Administered 2018-10-19: 1000 mg via INTRAVENOUS
  Filled 2018-10-19: qty 100

## 2018-10-19 MED ORDER — ONDANSETRON HCL 4 MG PO TABS: 4.0000 mg | ORAL_TABLET | Freq: Every day | ORAL | 1 refills | Status: AC | PRN

## 2018-10-19 MED ORDER — DOCUSATE SODIUM 100 MG PO CAPS
100.0000 mg | ORAL_CAPSULE | Freq: Two times a day (BID) | ORAL | Status: DC
  Administered 2018-10-19 – 2018-10-21 (×4): 100 mg via ORAL
  Filled 2018-10-19 (×4): qty 1

## 2018-10-19 MED ORDER — PHENYLEPHRINE 40 MCG/ML (10ML) SYRINGE FOR IV PUSH (FOR BLOOD PRESSURE SUPPORT)
PREFILLED_SYRINGE | INTRAVENOUS | Status: DC | PRN
  Administered 2018-10-19: 80 ug via INTRAVENOUS
  Administered 2018-10-19: 40 ug via INTRAVENOUS

## 2018-10-19 MED ORDER — OXYCODONE HCL 5 MG PO TABS
5.0000 mg | ORAL_TABLET | ORAL | Status: DC | PRN
  Administered 2018-10-20 – 2018-10-21 (×8): 10 mg via ORAL
  Filled 2018-10-19 (×8): qty 2

## 2018-10-19 MED ORDER — MORPHINE SULFATE (PF) 4 MG/ML IV SOLN
2.0000 mg | INTRAVENOUS | Status: DC | PRN
  Administered 2018-10-19: 4 mg via INTRAVENOUS
  Filled 2018-10-19: qty 1

## 2018-10-19 MED ORDER — ROCURONIUM BROMIDE 10 MG/ML (PF) SYRINGE
PREFILLED_SYRINGE | INTRAVENOUS | Status: DC | PRN
  Administered 2018-10-19 (×2): 10 mg via INTRAVENOUS
  Administered 2018-10-19: 50 mg via INTRAVENOUS

## 2018-10-19 MED ORDER — CEFAZOLIN SODIUM-DEXTROSE 2-4 GM/100ML-% IV SOLN
2.0000 g | INTRAVENOUS | Status: AC
  Administered 2018-10-19: 2 g via INTRAVENOUS

## 2018-10-19 MED ORDER — FENTANYL CITRATE (PF) 100 MCG/2ML IJ SOLN
INTRAMUSCULAR | Status: AC
  Filled 2018-10-19: qty 4

## 2018-10-19 MED ORDER — LIDOCAINE 5 % EX PTCH
1.0000 | MEDICATED_PATCH | CUTANEOUS | Status: DC
  Administered 2018-10-19 – 2018-10-20 (×2): 1 via TRANSDERMAL
  Filled 2018-10-19 (×3): qty 1

## 2018-10-19 MED ORDER — HYDROCODONE-ACETAMINOPHEN 5-325 MG PO TABS: 1.0000 | ORAL_TABLET | Freq: Four times a day (QID) | ORAL | 0 refills | Status: AC | PRN

## 2018-10-19 MED ORDER — IOPAMIDOL (ISOVUE-300) INJECTION 61%
50.0000 mL | Freq: Once | INTRAVENOUS | Status: AC | PRN
  Administered 2018-10-19: 10 mL

## 2018-10-19 MED ORDER — IOPAMIDOL (ISOVUE-300) INJECTION 61%
INTRAVENOUS | Status: AC
  Administered 2018-10-19: 10 mL
  Filled 2018-10-19: qty 50

## 2018-10-19 MED ORDER — FENTANYL CITRATE (PF) 250 MCG/5ML IJ SOLN
INTRAMUSCULAR | Status: AC
  Filled 2018-10-19: qty 5

## 2018-10-19 MED ORDER — ACETAMINOPHEN 325 MG PO TABS: 325.0000 mg | ORAL_TABLET | ORAL | Status: DC | PRN

## 2018-10-19 MED ORDER — MIDAZOLAM HCL 2 MG/2ML IJ SOLN
2.0000 mg | Freq: Once | INTRAMUSCULAR | Status: AC
  Administered 2018-10-19: 2 mg via INTRAVENOUS

## 2018-10-19 MED ORDER — FENTANYL CITRATE (PF) 100 MCG/2ML IJ SOLN
INTRAMUSCULAR | Status: DC | PRN
  Administered 2018-10-19: 25 ug via INTRAVENOUS
  Administered 2018-10-19: 150 ug via INTRAVENOUS
  Administered 2018-10-19: 50 ug via INTRAVENOUS
  Administered 2018-10-19: 25 ug via INTRAVENOUS

## 2018-10-19 MED ORDER — FENTANYL CITRATE (PF) 100 MCG/2ML IJ SOLN
INTRAMUSCULAR | Status: AC | PRN
  Administered 2018-10-19 (×2): 50 ug via INTRAVENOUS

## 2018-10-19 MED ORDER — MIDAZOLAM HCL 2 MG/2ML IJ SOLN
INTRAMUSCULAR | Status: AC
  Filled 2018-10-19: qty 4

## 2018-10-19 MED ORDER — PROPOFOL 10 MG/ML IV BOLUS
INTRAVENOUS | Status: DC | PRN
  Administered 2018-10-19: 150 mg via INTRAVENOUS

## 2018-10-19 MED ORDER — SUGAMMADEX SODIUM 200 MG/2ML IV SOLN
INTRAVENOUS | Status: AC
  Filled 2018-10-19: qty 2

## 2018-10-19 MED ORDER — LIDOCAINE 2% (20 MG/ML) 5 ML SYRINGE
INTRAMUSCULAR | Status: DC | PRN
  Administered 2018-10-19: 50 mg via INTRAVENOUS

## 2018-10-19 MED ORDER — PROPOFOL 10 MG/ML IV BOLUS
INTRAVENOUS | Status: AC
  Filled 2018-10-19: qty 20

## 2018-10-19 MED ORDER — OXYCODONE HCL 5 MG/5ML PO SOLN
5.0000 mg | Freq: Once | ORAL | Status: DC | PRN
  Filled 2018-10-19: qty 5

## 2018-10-19 MED ORDER — HYDROMORPHONE HCL 1 MG/ML IJ SOLN: 0.2500 mg | INTRAMUSCULAR | Status: DC | PRN

## 2018-10-19 MED ORDER — ONDANSETRON HCL 4 MG/2ML IJ SOLN
INTRAMUSCULAR | Status: DC | PRN
  Administered 2018-10-19: 4 mg via INTRAVENOUS

## 2018-10-19 MED ORDER — ONDANSETRON HCL 4 MG/2ML IJ SOLN: 4.0000 mg | INTRAMUSCULAR | Status: DC | PRN

## 2018-10-19 MED ORDER — CIPROFLOXACIN IN D5W 400 MG/200ML IV SOLN
INTRAVENOUS | Status: DC | PRN
  Administered 2018-10-19: 400 mg via INTRAVENOUS

## 2018-10-19 MED ORDER — OXYCODONE HCL 5 MG PO TABS: 5.0000 mg | ORAL_TABLET | ORAL | Status: DC | PRN

## 2018-10-19 MED ORDER — HYDROMORPHONE HCL 1 MG/ML IJ SOLN
1.0000 mg | INTRAMUSCULAR | Status: DC | PRN
  Administered 2018-10-19 – 2018-10-21 (×11): 1 mg via INTRAVENOUS
  Filled 2018-10-19 (×13): qty 1

## 2018-10-19 MED ORDER — IOHEXOL 300 MG/ML  SOLN
INTRAMUSCULAR | Status: DC | PRN
  Administered 2018-10-19: 23 mL

## 2018-10-19 MED ORDER — SUGAMMADEX SODIUM 200 MG/2ML IV SOLN
INTRAVENOUS | Status: DC | PRN
  Administered 2018-10-19: 350 mg via INTRAVENOUS

## 2018-10-19 MED ORDER — OXYBUTYNIN CHLORIDE 5 MG PO TABS: 5.0000 mg | ORAL_TABLET | Freq: Three times a day (TID) | ORAL | Status: DC | PRN

## 2018-10-19 MED ORDER — HYDROMORPHONE HCL 1 MG/ML IJ SOLN
0.5000 mg | INTRAMUSCULAR | Status: DC | PRN
  Administered 2018-10-19: 1 mg via INTRAVENOUS
  Administered 2018-10-19: 0.5 mg via INTRAVENOUS

## 2018-10-19 MED ORDER — ONDANSETRON HCL 4 MG/2ML IJ SOLN: 4.0000 mg | Freq: Once | INTRAMUSCULAR | Status: DC | PRN

## 2018-10-19 MED ORDER — CIPROFLOXACIN IN D5W 400 MG/200ML IV SOLN
INTRAVENOUS | Status: AC
  Filled 2018-10-19: qty 200

## 2018-10-19 MED ORDER — CEFAZOLIN SODIUM-DEXTROSE 2-4 GM/100ML-% IV SOLN
2.0000 g | Freq: Once | INTRAVENOUS | Status: DC
  Filled 2018-10-19: qty 100

## 2018-10-19 MED ORDER — LIDOCAINE HCL (PF) 1 % IJ SOLN
INTRAMUSCULAR | Status: AC | PRN
  Administered 2018-10-19: 10 mL

## 2018-10-19 MED ORDER — ACETAMINOPHEN 160 MG/5ML PO SOLN: 325.0000 mg | ORAL | Status: DC | PRN

## 2018-10-19 MED ORDER — SODIUM CHLORIDE 0.9 % IR SOLN
Status: DC | PRN
  Administered 2018-10-19: 35000 mL

## 2018-10-19 MED ORDER — FENTANYL CITRATE (PF) 100 MCG/2ML IJ SOLN
25.0000 ug | INTRAMUSCULAR | Status: DC | PRN
  Administered 2018-10-19: 50 ug via INTRAVENOUS

## 2018-10-19 MED ORDER — FENTANYL CITRATE (PF) 100 MCG/2ML IJ SOLN
INTRAMUSCULAR | Status: AC
  Filled 2018-10-19: qty 2

## 2018-10-19 MED ORDER — LACTATED RINGERS IV SOLN
INTRAVENOUS | Status: DC
  Administered 2018-10-19 (×2): via INTRAVENOUS

## 2018-10-19 MED ORDER — CIPROFLOXACIN HCL 500 MG PO TABS
500.0000 mg | ORAL_TABLET | Freq: Two times a day (BID) | ORAL | Status: DC
  Administered 2018-10-19 – 2018-10-21 (×4): 500 mg via ORAL
  Filled 2018-10-19 (×4): qty 1

## 2018-10-19 MED ORDER — OXYCODONE HCL 5 MG PO TABS: 5.0000 mg | ORAL_TABLET | Freq: Once | ORAL | Status: DC | PRN

## 2018-10-19 MED ORDER — SUGAMMADEX SODIUM 500 MG/5ML IV SOLN
INTRAVENOUS | Status: AC
  Filled 2018-10-19: qty 5

## 2018-10-19 MED ORDER — DEXAMETHASONE SODIUM PHOSPHATE 10 MG/ML IJ SOLN
INTRAMUSCULAR | Status: DC | PRN
  Administered 2018-10-19: 8 mg via INTRAVENOUS

## 2018-10-19 MED ORDER — 0.9 % SODIUM CHLORIDE (POUR BTL) OPTIME
TOPICAL | Status: DC | PRN
  Administered 2018-10-19: 1000 mL

## 2018-10-19 MED ORDER — ACETAMINOPHEN 10 MG/ML IV SOLN
1000.0000 mg | Freq: Once | INTRAVENOUS | Status: DC | PRN
  Administered 2018-10-19: 1000 mg via INTRAVENOUS

## 2018-10-19 MED ORDER — CIPROFLOXACIN HCL 250 MG PO TABS: 250.0000 mg | ORAL_TABLET | Freq: Two times a day (BID) | ORAL | 0 refills | Status: AC

## 2018-10-19 MED ORDER — DEXTROSE-NACL 5-0.45 % IV SOLN
INTRAVENOUS | Status: DC
  Administered 2018-10-19 – 2018-10-20 (×2): via INTRAVENOUS

## 2018-10-19 MED ORDER — MIDAZOLAM HCL 2 MG/2ML IJ SOLN
INTRAMUSCULAR | Status: AC
  Filled 2018-10-19: qty 2

## 2018-10-19 MED ORDER — OXYBUTYNIN CHLORIDE ER 5 MG PO TB24: 5.0000 mg | ORAL_TABLET | Freq: Every day | ORAL | 0 refills | Status: AC

## 2018-10-19 MED ORDER — MEPERIDINE HCL 50 MG/ML IJ SOLN: 6.2500 mg | INTRAMUSCULAR | Status: DC | PRN

## 2018-10-19 SURGICAL SUPPLY — 56 items
BAG URINE DRAINAGE (UROLOGICAL SUPPLIES) IMPLANT
BASKET STONE NITINOL 3FRX115MB (UROLOGICAL SUPPLIES) IMPLANT
BASKET ZERO TIP NITINOL 2.4FR (BASKET) ×3 IMPLANT
BENZOIN TINCTURE PRP APPL 2/3 (GAUZE/BANDAGES/DRESSINGS) IMPLANT
CATH FOLEY 2W COUNCIL 20FR 5CC (CATHETERS) ×3 IMPLANT
CATH FOLEY 2WAY SLVR  5CC 18FR (CATHETERS) ×2
CATH FOLEY 2WAY SLVR 5CC 18FR (CATHETERS) ×1 IMPLANT
CATH IMAGER II 65CM (CATHETERS) IMPLANT
CATH INTERMIT  6FR 70CM (CATHETERS) ×3 IMPLANT
CATH ROBINSON RED A/P 20FR (CATHETERS) IMPLANT
CATH X-FORCE N30 NEPHROSTOMY (TUBING) ×3 IMPLANT
CHLORAPREP W/TINT 26ML (MISCELLANEOUS) ×3 IMPLANT
COVER SURGICAL LIGHT HANDLE (MISCELLANEOUS) ×3 IMPLANT
COVER WAND RF STERILE (DRAPES) IMPLANT
DRAPE C-ARM 42X120 X-RAY (DRAPES) ×3 IMPLANT
DRAPE LINGEMAN PERC (DRAPES) ×3 IMPLANT
DRAPE SURG IRRIG POUCH 19X23 (DRAPES) ×3 IMPLANT
DRAPE UTILITY XL STRL (DRAPES) IMPLANT
DRSG PAD ABDOMINAL 8X10 ST (GAUZE/BANDAGES/DRESSINGS) IMPLANT
DRSG TEGADERM 8X12 (GAUZE/BANDAGES/DRESSINGS) ×3 IMPLANT
FIBER LASER FLEXIVA 1000 (UROLOGICAL SUPPLIES) IMPLANT
FIBER LASER FLEXIVA 365 (UROLOGICAL SUPPLIES) IMPLANT
FIBER LASER FLEXIVA 550 (UROLOGICAL SUPPLIES) IMPLANT
FIBER LASER TRAC TIP (UROLOGICAL SUPPLIES) IMPLANT
GAUZE SPONGE 4X4 12PLY STRL (GAUZE/BANDAGES/DRESSINGS) ×3 IMPLANT
GLOVE BIOGEL M STRL SZ7.5 (GLOVE) ×3 IMPLANT
GOWN STRL REUS W/TWL XL LVL3 (GOWN DISPOSABLE) ×3 IMPLANT
GUIDEWIRE AMPLAZ .035X145 (WIRE) ×6 IMPLANT
GUIDEWIRE ANG ZIPWIRE 038X150 (WIRE) IMPLANT
GUIDEWIRE STR DUAL SENSOR (WIRE) ×3 IMPLANT
KIT BASIN OR (CUSTOM PROCEDURE TRAY) ×3 IMPLANT
KIT PROBE TRILOGY 3.9X350 (MISCELLANEOUS) ×6 IMPLANT
MANIFOLD NEPTUNE II (INSTRUMENTS) ×3 IMPLANT
NEEDLE TROCAR 18X15 ECHO (NEEDLE) ×3 IMPLANT
NS IRRIG 1000ML POUR BTL (IV SOLUTION) IMPLANT
PACK CYSTO (CUSTOM PROCEDURE TRAY) ×3 IMPLANT
PAD ABD 8X10 STRL (GAUZE/BANDAGES/DRESSINGS) ×3 IMPLANT
PROBE LITHOCLAST ULTRA 3.8X403 (UROLOGICAL SUPPLIES) IMPLANT
PROBE PNEUMATIC 1.0MMX570MM (UROLOGICAL SUPPLIES) IMPLANT
SET IRRIG Y TYPE TUR BLADDER L (SET/KITS/TRAYS/PACK) IMPLANT
SHEATH PEELAWAY SET 9 (SHEATH) ×3 IMPLANT
SPONGE LAP 4X18 RFD (DISPOSABLE) ×3 IMPLANT
STENT ENDOURETEROTOMY 7-14 26C (STENTS) IMPLANT
STONE CATCHER W/TUBE ADAPTER (UROLOGICAL SUPPLIES) ×3 IMPLANT
SUT SILK 2 0 30  PSL (SUTURE) ×2
SUT SILK 2 0 30 PSL (SUTURE) ×1 IMPLANT
SYR 10ML LL (SYRINGE) ×3 IMPLANT
SYR 20CC LL (SYRINGE) ×6 IMPLANT
TOWEL OR 17X26 10 PK STRL BLUE (TOWEL DISPOSABLE) ×3 IMPLANT
TOWEL OR NON WOVEN STRL DISP B (DISPOSABLE) ×3 IMPLANT
TRAY FOLEY CATH 14FR (SET/KITS/TRAYS/PACK) ×3 IMPLANT
TRAY FOLEY MTR SLVR 16FR STAT (SET/KITS/TRAYS/PACK) IMPLANT
TUBING CONNECTING 10 (TUBING) ×2 IMPLANT
TUBING CONNECTING 10' (TUBING) ×1
TUBING UROLOGY SET (TUBING) ×3 IMPLANT
WATER STERILE IRR 1000ML POUR (IV SOLUTION) ×3 IMPLANT

## 2018-10-19 NOTE — Procedures (Signed)
Interventional Radiology Procedure Note  Procedure: Ureteral catheter placement for PCNL  Complications: None  Estimated Blood Loss: < 10 mL  Findings: Upper pole access just above 12th rib at upper portion of a huge right staghorn calculus. 5 Fr catheter advanced to distal ureter just above UVJ. Indwelling ureteral stent well positioned. For OR PCNL later today.  Jodi Marble. Fredia Sorrow, M.D Pager:  [phone]

## 2018-10-19 NOTE — Anesthesia Postprocedure Evaluation (Signed)
Anesthesia Post Note  Patient: Monica Boyd  Procedure(s) Performed: NEPHROLITHOTOMY PERCUTANEOUS (Right )     Patient location during evaluation: PACU Anesthesia Type: General Level of consciousness: sedated Pain management: pain level controlled Vital Signs Assessment: post-procedure vital signs reviewed and stable Respiratory status: spontaneous breathing Cardiovascular status: stable Postop Assessment: no apparent nausea or vomiting Anesthetic complications: no    Last Vitals:  Vitals:   10/19/18 1245 10/19/18 1619  BP: 114/86 112/68  Pulse: 63 71  Resp:  13  Temp:    SpO2: 100% 100%    Last Pain:  Vitals:   10/19/18 1630  TempSrc:   PainSc: 10-Worst pain ever   Pain Goal:                   Caren Macadam

## 2018-10-19 NOTE — Discharge Summary (Signed)
[facility] Discharge Summary  Admit date: 10/19/2018  Discharge date and time: 10/21/18   Discharge to: Home  Discharge Service: Urology  Discharge Attending Physician:  Bjorn Pippin, MD  Discharge  Diagnoses: <principal problem not specified>  Secondary Diagnosis: Active Problems:   Nephrolithiasis   OR Procedures: Procedure(s): NEPHROLITHOTOMY PERCUTANEOUS 10/19/2018   Ancillary Procedures: None   Discharge Day Services: The patient was seen and examined by the Urology team both in the morning and immediately prior to discharge.  Vital signs and laboratory values were stable and within normal limits.  The physical exam was benign and unchanged and all surgical wounds were examined.  Discharge instructions were explained and all questions answered.  Subjective  No acute events overnight. Pain Controlled. No fever or chills.  Objective Patient Vitals for the past 8 hrs:  BP Temp Temp src Pulse Resp SpO2  10/21/18 0622 121/71 99.6 F (37.6 C) Oral 94 17 98 %   Total I/O In: -  Out: 300 [Urine:300]  General Appearance:        No acute distress Lungs:                      Normal work of breathing on room air Heart:                                Regular rate and rhythm Abdomen:                         Soft, non-tender, non-distended Extremities:                      Warm and well perfused   Hospital Course:  The patient underwent right PCNL on 10/19/2018.  The patient tolerated the procedure well, was extubated in the OR, and afterwards was taken to the PACU for routine post-surgical care. When stable the patient was transferred to the floor.   The patient did well postoperatively.  The patient's diet was slowly advanced and at the time of discharge was tolerating a regular diet.  Prior to discharge, her 6Fr open ended ureteral catheter was removed and her nephrostomy tube was capped. After a successful cap trial, her nephrostomy tube was removed without complication. The  patient was discharged home on POD#2, at which point was tolerating a regular solid diet, was able to void spontaneously, have adequate pain control with P.O. pain medication, and could ambulate without difficulty. The patient will follow up with [place] for post op check.   Condition at Discharge: Improved  Discharge Medications:  Allergies as of 10/21/2018   No Known Allergies     Medication List    STOP taking these medications   cephALEXin 500 MG capsule Commonly known as:  KEFLEX     TAKE these medications   acetaminophen 500 MG tablet Commonly known as:  TYLENOL Take 500 mg by mouth every 6 (six) hours as needed for moderate pain.   ciprofloxacin 250 MG tablet Commonly known as:  CIPRO Take 1 tablet (250 mg total) by mouth 2 (two) times daily for 5 days.   HYDROcodone-acetaminophen 5-325 MG tablet Commonly known as:  NORCO Take 1 tablet by mouth every 6 (six) hours as needed for moderate pain. What changed:    how much to take  reasons to take this   ibuprofen 200 MG tablet Commonly known as:  ADVIL,MOTRIN  Take 400 mg by mouth every 8 (eight) hours as needed for moderate pain.   NORVASC 5 MG tablet Generic drug:  amLODipine Take 5 mg by mouth 2 (two) times daily.   ondansetron 4 MG tablet Commonly known as:  ZOFRAN Take 1 tablet (4 mg total) by mouth daily as needed for nausea or vomiting. What changed:  when to take this   oxybutynin 5 MG 24 hr tablet Commonly known as:  DITROPAN XL Take 1 tablet (5 mg total) by mouth at bedtime.   tamsulosin 0.4 MG Caps capsule Commonly known as:  FLOMAX Take 1 capsule (0.4 mg total) by mouth daily after supper.

## 2018-10-19 NOTE — Consult Note (Addendum)
Chief Complaint: Patient was seen in consultation today for right percutaneous nephrostomy/nephroureteral catheter placement   Referring Physician(s): Eskridge,M  Supervising Physician: Irish Lack  Patient Status: [facility] - Out-pt  TBA  History of Present Illness: Monica Boyd is a 43 y.o. female with history of intermittent right flank pain, hematuria, dysuria, urinary frequency and recent imaging revealing massive stone within the right renal collecting system measuring up to 4.4 cm with mild right hydroureter and mild perinephric stranding.  She presents today for right percutaneous nephrostomy/nephroureteral catheter placement prior to nephrolithotomy.  Past Medical History:  Diagnosis Date  . Blood transfusion without reported diagnosis    no reaction per patient   . HTN (hypertension)   . Kidney stone   . PONV (postoperative nausea and vomiting)     Past Surgical History:  Procedure Laterality Date  . ABDOMINAL HYSTERECTOMY    . APPENDECTOMY    . CESAREAN SECTION      Allergies: Patient has no known allergies.  Medications: Prior to Admission medications   Medication Sig Start Date End Date Taking? Authorizing Provider  amLODipine (NORVASC) 5 MG tablet Take 5 mg by mouth 2 (two) times daily.    Yes [provider]  cephALEXin (KEFLEX) 500 MG capsule Take 500 mg by mouth 4 (four) times daily.   Yes [provider]  acetaminophen (TYLENOL) 500 MG tablet Take 500 mg by mouth every 6 (six) hours as needed for moderate pain.    [provider]  HYDROcodone-acetaminophen (NORCO/VICODIN) 5-325 MG tablet Take 1-2 tablets by mouth every 6 (six) hours as needed for moderate pain or severe pain. Patient not taking: Reported on 10/04/2018 08/15/18   Shaune Pollack, MD  ibuprofen (ADVIL,MOTRIN) 200 MG tablet Take 400 mg by mouth every 8 (eight) hours as needed for moderate pain.     [provider]  ondansetron (ZOFRAN) 4 MG tablet Take 1  tablet (4 mg total) by mouth every 8 (eight) hours as needed for nausea or vomiting. Patient not taking: Reported on 10/04/2018 08/15/18   Shaune Pollack, MD     History reviewed. No pertinent family history.  Social History   Socioeconomic History  . Marital status: Divorced    Spouse name: Not on file  . Number of children: Not on file  . Years of education: Not on file  . Highest education level: Not on file  Occupational History  . Not on file  Social Needs  . Financial resource strain: Not on file  . Food insecurity:    Worry: Not on file    Inability: Not on file  . Transportation needs:    Medical: Not on file    Non-medical: Not on file  Tobacco Use  . Smoking status: Never Smoker  . Smokeless tobacco: Never Used  Substance and Sexual Activity  . Alcohol use: Not on file  . Drug use: Not on file  . Sexual activity: Not on file  Lifestyle  . Physical activity:    Days per week: Not on file    Minutes per session: Not on file  . Stress: Not on file  Relationships  . Social connections:    Talks on phone: Not on file    Gets together: Not on file    Attends religious service: Not on file    Active member of club or organization: Not on file    Attends meetings of clubs or organizations: Not on file    Relationship status: Not on file  Other Topics Concern  . Not on file  Social History Narrative  . Not on file      Review of Systems currently denies fever, headache, chest pain, dyspnea, cough, abdominal pain, back pain, nausea, vomiting.  Vital Signs: BP 122/83 (BP Location: Right Arm)   Pulse 85   Temp 98.1 F (36.7 C) (Oral)   Resp 18   SpO2 96%   Physical Exam awake, alert.  Chest clear to auscultation bilaterally.  Heart with regular rate and rhythm.  Abdomen soft, positive bowel sounds, nontender.  No lower extremity edema.  Imaging: No results found.  Labs:  CBC: Recent Labs    08/15/18 1625 10/16/18 1107 10/19/18 0847  WBC 16.0*  7.8 6.8  HGB 13.1 12.4 12.5  HCT 41.5 39.7 39.4  PLT 263 284 297    COAGS: No results for input(s): INR, APTT in the last 8760 hours.  BMP: Recent Labs    08/15/18 1625 10/16/18 1107  NA 144 142  K 3.8 3.7  CL 105 108  CO2 29 25  GLUCOSE 93 97  BUN 20 34*  CALCIUM 9.0 9.7  CREATININE 1.03* 1.18*  GFRNONAA >60 56*  GFRAA >60 >60    LIVER FUNCTION TESTS: Recent Labs    08/15/18 1625  BILITOT 0.4  AST 18  ALT 12  ALKPHOS 61  PROT 6.4*  ALBUMIN 3.6    TUMOR MARKERS: No results for input(s): AFPTM, CEA, CA199, CHROMGRNA in the last 8760 hours.  Assessment and Plan: 43 y.o. female with history of intermittent right flank pain, hematuria, dysuria, urinary frequency and recent imaging revealing massive stone within the right renal collecting system measuring up to 4.4 cm with mild right hydroureter and mild perinephric stranding. She reportedly has rt ureteral stent in place as well.  She presents today for right percutaneous nephrostomy/nephroureteral catheter placement prior to nephrolithotomy.Risks and benefits of procedure were discussed with the patient including, but not limited to, infection, bleeding, significant bleeding causing loss or decrease in renal function or damage to adjacent structures.   All of the patient's questions were answered, patient is agreeable to proceed.  Consent signed and in chart.      Thank you for this interesting consult.  I greatly enjoyed meeting Monica Boyd and look forward to participating in their care.  A copy of this report was sent to the requesting provider on this date.  Electronically Signed: D. Jeananne Rama, PA-C 10/19/2018, 8:59 AM   I spent a total of 25 minutes in face to face in clinical consultation, greater than 50% of which was counseling/coordinating care for right percutaneous nephrostomy/nephroureteral catheter placement

## 2018-10-19 NOTE — Transfer of Care (Signed)
Immediate Anesthesia Transfer of Care Note  Patient: Monica Boyd  Procedure(s) Performed: NEPHROLITHOTOMY PERCUTANEOUS (Right )  Patient Location: PACU  Anesthesia Type:General  Level of Consciousness: drowsy  Airway & Oxygen Therapy: Patient Spontanous Breathing and Patient connected to face mask oxygen  Post-op Assessment: Report given to RN and Post -op Vital signs reviewed and stable  Post vital signs: Reviewed and stable  Last Vitals:  Vitals Value Taken Time  BP 112/68 10/19/2018  4:19 PM  Temp    Pulse 69 10/19/2018  4:20 PM  Resp 14 10/19/2018  4:20 PM  SpO2 100 % 10/19/2018  4:20 PM  Vitals shown include unvalidated device data.  Last Pain:  Vitals:   10/19/18 1100  TempSrc: Oral         Complications: No apparent anesthesia complications

## 2018-10-19 NOTE — Interval H&P Note (Signed)
History and Physical Interval Note:  10/19/2018 12:24 PM  Monica Boyd  has presented today for surgery, with the diagnosis of RIGHT STAGHORN STONE  The various methods of treatment have been discussed with the patient and family. After consideration of risks, benefits and other options for treatment, the patient has consented to  Procedure(s): NEPHROLITHOTOMY PERCUTANEOUS (Right) as a surgical intervention.  The patient's history has been reviewed, patient examined, no change in status, stable for surgery.  I reviewed the percutaneous access images and discussed with Dr. Fredia Sorrow.  The patient has been well.  She is been on antibiotics since her stent was placed.  She has had no dysuria or fever.  She has some flank pain after the access was placed and some pain when she breathes but no shortness of breath.  Her saturations are normal.  I have reviewed the patient's chart and labs. We discussed again the nature, potential benefits, risks and alternatives to right PCNL and stent placement, including side effects of the proposed treatment, the likelihood of the patient achieving the goals of the procedure, and any potential problems that might occur during the procedure or recuperation. Questions were answered to the patient's satisfaction.  We discussed she will likely need a staged procedure to clear the stone with possible ureteroscopy in a few weeks.  She understands stents are temporary and the importance of follow-up.  She elects to proceed.   Jerilee Field

## 2018-10-19 NOTE — Anesthesia Procedure Notes (Signed)
Procedure Name: Intubation Date/Time: 10/19/2018 1:18 PM Performed by: Anne Fu, CRNA Pre-anesthesia Checklist: Patient identified, Emergency Drugs available, Suction available, Patient being monitored and Timeout performed Patient Re-evaluated:Patient Re-evaluated prior to induction Oxygen Delivery Method: Circle system utilized Preoxygenation: Pre-oxygenation with 100% oxygen Induction Type: IV induction Ventilation: Mask ventilation without difficulty Laryngoscope Size: Mac and 4 Grade View: Grade II Tube type: Oral Tube size: 7.5 mm Number of attempts: 1 Airway Equipment and Method: Stylet Placement Confirmation: ETT inserted through vocal cords under direct vision,  positive ETCO2 and breath sounds checked- equal and bilateral Secured at: 22 cm Tube secured with: Tape Dental Injury: Teeth and Oropharynx as per pre-operative assessment

## 2018-10-19 NOTE — Op Note (Addendum)
Preoperative Diagnosis:  Rightrenal greaterthan 2 cm  Postoperative Diagnosis:  Rightrenal stone greaterthan 2 cm  Procedure(s) Performed:  1.Rightpercutaneous nephrostolithotomy for stone burden greaterthan 2 cm 2. Right ureteroscopy 3. Antegrade nephrostogram with nephrostomy tube placement 4. Right ureteral stent removal 5. Intraoperative fluoroscopy with interpretation less than 1 hour  Teaching Surgeon:Maddex Garlitz Mena Goes, MD  Resident Surgeon:Case Lucretia Roers, MD  Assistants: None listed  Anesthesia: General via endotracheal tube.   IV Fluids: See Anesthesia record.  Estimated Blood Loss:13mL  Cultures:None  Drains: - 6Fr open ended ureteral catheter - 20Fr Council tip catheter as nephrostomy tube  Specimens:Rightrenal stone  Complications: None.  Indications for Surgery:43 y.o. female with a history of nephrolithiasis. The patient was evaluated and noted with staghorn right renal stone burden with a total stone diameter of > 2cm. The patient presents today for percutaneous treatment of rightkidney stone after right nephrostomy tube placed in IR. The risks and benefits of the procedure were discussed with the patient who wishes to proceed.  Operative Findings: - All stone was treated, no residual stone noted on flexible pyeloscopy or ureteroscopy -Successful placement of 6Fr open ended ureteral catheter, placed antegrade - Successful placement of 20Fr council tip catheter as nephrostomy tube  Radiologic Interpretation of Retrograde Pyelogram and Anterograde Nephrostogram: -Completionantegrade nephrostogram post-treatment demonstrated no contrast extravasation or filling defect.  Procedure: The patient was correctly identified in the preoperative holding area where written informed consent as well as potential risks and complications were reviewed. The patient was brought back to the operative suite where a  preinduction timeout was performed. Once correct information was verified, general anesthesia was induced via endotracheal tube. The patient was then gently repositioned into the prone position, paying careful attention to pad all pressure points and affixed the patient to the bed at multiple points of contact. Shewas then prepped and draped in the usual sterile fashion and given appropriate perioperative procedural antibiotics. Sequential compression devices were placed for VTE prophylaxis. A second timeout was then performed.  At the beginning of the case, a sensor wire was advanced through the IR nephrostomy tube and into the bladder which was confirmed under fluoroscopy. Next, using a combination of catheters and dilators, we placed a second safety wire and then developed our percutaneous tract with the advancement of a 30 French x 20 cm Nephromax balloon dilator. After this, a 30 French sheath was advanced to the edge of the distal calyx on fluoroscopy.  We then performed rigid nephroscopy with the lithotrite. Immediately upon entrance into the collecting system, we encountered the stone and we then proceeded to treat the stone with lithotripsy. We then switched to flexible nephroscopy and after thorough analysis of the entire collecting system, there was no additional stone burden. We then switched to flexible ureteroscopy and visualized the entire ureter to the bladder in an antegrade fashion.At the conclusion of the procedure, we repeated flexible nephroscopy in the kidney as well and withdrew our sheath over our rigid nephroscope to the edge of renal parenchyma which did notreveal any residual stone fragments.  At this point, we elected to leave a ureteral stent and nephrostomy tube. A 6Fr open ended ureteral catheter was placed over a sensor wire and advanced into the distal ureter. Using a second sensor wire, a 20Fr Council-tip catheter was advanced into the renal pelvis under  fluoroscopic guidance. An antegrade nephrostogram confirmed appropriate placement in the renal pelvis. The sheath was removed. A completion antegrade nephrostogram demonstrated complete filling of the entire renal collecting system with  minimal contrast extravasation from the access tract and satisfactory placement of her nephrostomy tube in the renal pelvis. The tube was draining clear urine. A figure of eight nylon suture was used to secure both the open ended catheter and the nephrostomy tube in place, placing the nephrostomy tube to drainage.The site was then dressed in the usual gauze and tape dressing and the patient was carefully returned to supine position. At this point, the patient was extubated and taken to the recovery area in stable fashion.

## 2018-10-20 ENCOUNTER — Observation Stay (HOSPITAL_COMMUNITY): Payer: Self-pay

## 2018-10-20 LAB — BASIC METABOLIC PANEL
Anion gap: 8 (ref 5–15)
BUN: 19 mg/dL (ref 6–20)
CO2: 24 mmol/L (ref 22–32)
Calcium: 8.4 mg/dL — ABNORMAL LOW (ref 8.9–10.3)
Chloride: 106 mmol/L (ref 98–111)
Creatinine, Ser: 0.88 mg/dL (ref 0.44–1.00)
GFR calc non Af Amer: >60 mL/min (ref >60)
Glucose, Bld: 119 mg/dL — ABNORMAL HIGH (ref 70–99)
Potassium: 3.7 mmol/L (ref 3.5–5.1)
Sodium: 138 mmol/L (ref 135–145)

## 2018-10-20 LAB — CBC
HCT: 37.7 % (ref 36.0–46.0)
HEMOGLOBIN: 11.4 g/dL — AB (ref 12.0–15.0)
MCH: 29.5 pg (ref 26.0–34.0)
MCHC: 30.2 g/dL (ref 30.0–36.0)
MCV: 97.4 fL (ref 80.0–100.0)
Platelets: 217 10*3/uL (ref 150–400)
RBC: 3.87 MIL/uL (ref 3.87–5.11)
RDW: 14.3 % (ref 11.5–15.5)
WBC: 21.8 10*3/uL — ABNORMAL HIGH (ref 4.0–10.5)
nRBC: 0 % (ref 0.0–0.2)

## 2018-10-20 LAB — HIV ANTIBODY (ROUTINE TESTING W REFLEX): HIV Screen 4th Generation wRfx: NONREACTIVE

## 2018-10-20 MED ORDER — ACETAMINOPHEN 500 MG PO TABS
1000.0000 mg | ORAL_TABLET | Freq: Four times a day (QID) | ORAL | Status: DC
  Administered 2018-10-20 – 2018-10-21 (×4): 1000 mg via ORAL
  Filled 2018-10-20 (×5): qty 2

## 2018-10-20 MED ORDER — DIPHENHYDRAMINE HCL 50 MG/ML IJ SOLN
25.0000 mg | Freq: Once | INTRAMUSCULAR | Status: AC
  Administered 2018-10-20: 25 mg via INTRAVENOUS
  Filled 2018-10-20: qty 1

## 2018-10-20 NOTE — Progress Notes (Signed)
Urology Progress Note   1 Day Post-Op  Subjective/Interval: NAEON. AFVSS. UOP adequate. Reports right flank pain worsened with inspiration, but controlled with medication. Hgb stable. WBC 21.8. Cr WNL.  Objective: Vital signs in last 24 hours: Temp:  [97.2 F (36.2 C)-99.4 F (37.4 C)] 99.4 F (37.4 C) (02/15 0523) Pulse Rate:  [59-85] 75 (02/15 0523) Resp:  [11-20] 12 (02/15 0523) BP: (107-125)/(68-92) 108/68 (02/15 0523) SpO2:  [91 %-100 %] 100 % (02/15 0523)  Intake/Output from previous day: 02/14 0701 - 02/15 0700 In: 2933.6 [P.O.:444; I.V.:2189.6; IV Piggyback:300] Out: 1830 [Urine:1820; Blood:10] Intake/Output this shift: No intake/output data recorded.  Physical Exam:  General: Alert and oriented CV: RRR Lungs: Clear, Tullos in place Abdomen: Soft, non-tender, non-distended GU: Foley in place draining clear yellow urine, right nephrostomy tube draining thin watermelon color urine Ext: NT, No erythema  Lab Results: Recent Labs    10/19/18 0847 10/19/18 1649 10/20/18 0418  HGB 12.5 11.9* 11.4*  HCT 39.4 38.8 37.7   BMET Recent Labs    10/19/18 1649 10/20/18 0418  NA 141 138  K 3.4* 3.7  CL 110 106  CO2 25 24  GLUCOSE 124* 119*  BUN 25* 19  CREATININE 0.99 0.88  CALCIUM 8.4* 8.4*     Studies/Results: Dg C-arm 1-60 Min-no Report  Result Date: 10/19/2018 Fluoroscopy was utilized by the requesting physician.  No radiographic interpretation.   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 Percutaneous ureteral catheter access performed prior to schedule operative percutaneous nephrolithotomy. EXAM: PLACEMENT OF PERCUTANEOUS RIGHT URETERAL CATHETER UNDER FLUOROSCOPY COMPARISON:  CT of the abdomen and pelvis without contrast on 08/15/2018 MEDICATIONS: 2 g IV Ancef; The antibiotic was administered in an appropriate time frame prior to skin puncture.  ANESTHESIA/SEDATION: Fentanyl 100 mcg IV; Versed 3.0 mg IV Moderate Sedation Time:  20 minutes. The patient was continuously monitored during the procedure by the interventional radiology nurse under my direct supervision. CONTRAST:  - administered into the collecting system(s) FLUOROSCOPY TIME:  Fluoroscopy Time: 3 minutes and 18 seconds. 61.6 mGy. COMPLICATIONS: None immediate. PROCEDURE: Informed written consent was obtained from the patient after a thorough discussion of the procedural risks, benefits and alternatives. All questions were addressed. Maximal Sterile Barrier Technique was utilized including caps, mask, sterile gowns, sterile gloves, sterile drape, hand hygiene and skin antiseptic. A timeout was performed prior to the initiation of the procedure. Initial fluoroscopy was performed of the right kidney and ureter. The right flank region was prepped with chlorhexidine. Local anesthesia was provided with 1% lidocaine. Under fluoroscopy, access of the upper pole of the right kidney was performed at the level of a staghorn calculus with a 21 gauge needle just superior to the upper twelfth rib margin. Contrast was injected into the renal collecting system. A guidewire was advanced and a transitional dilator advanced over the wire. A 5 French catheter was then advanced over a wire into the distal right ureter. The catheter was secured at the skin with a Prolene retention suture and covered with a sterile adhesive dressing. Fluoroscopic spot images were obtained to confirm catheter positioning. FINDINGS: Fluoroscopy demonstrates a massive staghorn calculus occupying nearly the entire renal collecting system and distending the right renal pelvis. A ureteral stent is present along the medial aspect of the staghorn calculus and extending into the upper pole collecting system. Distal portion of the stent extends into the bladder. In order to maximize  calculus removal from a single approach, upper pole access was  chosen. The superior aspect of the staghorn calculus was punctured just above the twelfth rib. A 5 French catheter was able to be advanced along the medial margin of the calculus adjacent to the ureteral stent and down the ureter. Once the catheter was advanced to the level of the distal ureter just above the UVJ, it would not easily advanced over a wire into the bladder past the ureteral stent and was therefore left in the distal ureter. IMPRESSION: Percutaneous ureteral access for operative nephrolithotomy. Upper pole access of the right kidney was performed at the level of a large staghorn calculus and a 5 French catheter advanced into the distal ureter. The patient is scheduled for operative percutaneous nephrolithotomy later today. Electronically Signed   By: Irish Lack M.D.   On: 10/19/2018 11:27    Assessment/Plan:  43 y.o. female s/p R PCNL 2/14.  - Continue pain control with lidocaine patch, ice packs, PO medication, IV available PRN - Wean O2 - Obtain CXR given reports of pain with inspiration - medlock, regular diet - d/c Foley catheter - Continue ciprofloxacin as surgical prophylaxis - Continue right open ended ureteral catheter and right nephrostomy tube - AM labs - OOB, IS, SCDs   LOS: 0 days   Case Geri [place] 10/20/2018, 8:51 AM

## 2018-10-21 LAB — BASIC METABOLIC PANEL
Anion gap: 9 (ref 5–15)
BUN: 10 mg/dL (ref 6–20)
CO2: 24 mmol/L (ref 22–32)
CREATININE: 0.81 mg/dL (ref 0.44–1.00)
Calcium: 8.5 mg/dL — ABNORMAL LOW (ref 8.9–10.3)
Chloride: 104 mmol/L (ref 98–111)
GFR calc Af Amer: >60 mL/min (ref >60)
GFR calc non Af Amer: >60 mL/min (ref >60)
Glucose, Bld: 110 mg/dL — ABNORMAL HIGH (ref 70–99)
Potassium: 3.1 mmol/L — ABNORMAL LOW (ref 3.5–5.1)
Sodium: 137 mmol/L (ref 135–145)

## 2018-10-21 LAB — CBC
HCT: 36.2 % (ref 36.0–46.0)
Hemoglobin: 11.1 g/dL — ABNORMAL LOW (ref 12.0–15.0)
MCH: 29.5 pg (ref 26.0–34.0)
MCHC: 30.7 g/dL (ref 30.0–36.0)
MCV: 96.3 fL (ref 80.0–100.0)
Platelets: 198 10*3/uL (ref 150–400)
RBC: 3.76 MIL/uL — ABNORMAL LOW (ref 3.87–5.11)
RDW: 14.2 % (ref 11.5–15.5)
WBC: 14.8 10*3/uL — ABNORMAL HIGH (ref 4.0–10.5)
nRBC: 0 % (ref 0.0–0.2)

## 2018-10-21 MED ORDER — DIPHENHYDRAMINE HCL 25 MG PO CAPS
25.0000 mg | ORAL_CAPSULE | Freq: Once | ORAL | Status: AC | PRN
  Administered 2018-10-21: 25 mg via ORAL
  Filled 2018-10-21: qty 1

## 2018-10-21 NOTE — Progress Notes (Signed)
Reviewed discharge information with patient and boyfriend . Answered all questions. Pt able to teach back medications and reasons to contact MD/911. Patient verbalizes importance of follow up appointment.  Earnest Conroy. Clelia Croft, RN

## 2018-10-22 ENCOUNTER — Encounter (HOSPITAL_COMMUNITY): Payer: Self-pay | Admitting: Urology

## 2019-04-19 MED ORDER — GENERIC EXTERNAL MEDICATION
Status: DC
Start: 2019-04-19 — End: 2019-04-19

## 2019-04-19 MED ORDER — CIPROFLOXACIN 500 MG TAB
500 | ORAL | Status: DC
Start: 2019-04-19 — End: 2019-04-19

## 2019-04-19 MED ORDER — HEPARIN (PORCINE) 5,000 UNIT/ML IJ SOLN
5000 | INTRAMUSCULAR | Status: DC
Start: 2019-04-19 — End: 2019-04-19

## 2019-04-19 MED ORDER — ONDANSETRON (PF) 4 MG/2 ML INJECTION
4 | INTRAMUSCULAR | Status: DC | PRN
Start: 2019-04-19 — End: 2019-04-19

## 2019-04-19 MED ORDER — POLYETHYLENE GLYCOL 3350 17 GRAM (100 %) ORAL POWDER PACKET
17 | ORAL | Status: DC | PRN
Start: 2019-04-19 — End: 2019-04-19

## 2019-05-07 ENCOUNTER — Encounter

## 2019-05-07 NOTE — Telephone Encounter (Signed)
Pt called in and asked for ref to have ref sent for  stent removal to Dr. Wilford Grist office in [place], [place] and the office number is [phone] and the pt can be reached at [phone].

## 2019-05-07 NOTE — Telephone Encounter (Addendum)
Patient was called and informed that a letter containing the CT scan order and a KUB order will be sent to her address in [place].  Patient verbalized understanding.      **Patient needs a CT scan prior to stent removal, per Dr. Allyne Gee.  I called the patient and she states that she would like the order for the CT scan to be sent to:  [facility]    [address] [place], [place] [address]  Phone: [phone]

## 2019-05-08 ENCOUNTER — Encounter: Attending: Urology

## 2019-05-22 ENCOUNTER — Other Ambulatory Visit: Payer: Self-pay

## 2019-05-22 ENCOUNTER — Encounter (HOSPITAL_COMMUNITY): Payer: Self-pay

## 2019-05-22 DIAGNOSIS — N3001 Acute cystitis with hematuria: Secondary | ICD-10-CM | POA: Insufficient documentation

## 2019-05-22 DIAGNOSIS — I10 Essential (primary) hypertension: Secondary | ICD-10-CM | POA: Insufficient documentation

## 2019-05-22 DIAGNOSIS — Z960 Presence of urogenital implants: Secondary | ICD-10-CM | POA: Insufficient documentation

## 2019-05-22 DIAGNOSIS — K5900 Constipation, unspecified: Secondary | ICD-10-CM | POA: Insufficient documentation

## 2019-05-22 NOTE — ED Triage Notes (Signed)
Pt presents wanting a stent removal.  Pt had stent placed at a hospital in [place] on 8/12.  Pain score 9/10.  Pt reports she does not have insurance or a Dealer in the area.

## 2019-05-23 ENCOUNTER — Emergency Department (HOSPITAL_COMMUNITY): Payer: Self-pay

## 2019-05-23 ENCOUNTER — Emergency Department (HOSPITAL_COMMUNITY)
Admission: EM | Admit: 2019-05-23 | Discharge: 2019-05-23 | Disposition: A | Discharge status: Home / Self Care | Payer: Self-pay | Attending: Emergency Medicine | Admitting: Emergency Medicine

## 2019-05-23 ENCOUNTER — Encounter (HOSPITAL_COMMUNITY): Payer: Self-pay | Admitting: Emergency Medicine

## 2019-05-23 DIAGNOSIS — K5900 Constipation, unspecified: Secondary | ICD-10-CM

## 2019-05-23 DIAGNOSIS — N3001 Acute cystitis with hematuria: Secondary | ICD-10-CM

## 2019-05-23 DIAGNOSIS — Z960 Presence of urogenital implants: Secondary | ICD-10-CM

## 2019-05-23 LAB — I-STAT CHEM 8, ED
BUN: 16 mg/dL (ref 6–20)
Calcium, Ion: 1.24 mmol/L (ref 1.15–1.40)
Chloride: 105 mmol/L (ref 98–111)
Creatinine, Ser: 0.9 mg/dL (ref 0.44–1.00)
Glucose, Bld: 100 mg/dL — ABNORMAL HIGH (ref 70–99)
HCT: 32 % — ABNORMAL LOW (ref 36.0–46.0)
Hemoglobin: 10.9 g/dL — ABNORMAL LOW (ref 12.0–15.0)
Potassium: 3.2 mmol/L — ABNORMAL LOW (ref 3.5–5.1)
Sodium: 142 mmol/L (ref 135–145)
TCO2: 26 mmol/L (ref 22–32)

## 2019-05-23 LAB — URINALYSIS, ROUTINE W REFLEX MICROSCOPIC
Bilirubin Urine: NEGATIVE
Glucose, UA: NEGATIVE mg/dL
Ketones, ur: NEGATIVE mg/dL
Nitrite: NEGATIVE
Protein, ur: 100 mg/dL — AB
RBC / HPF: >50 RBC/hpf — ABNORMAL HIGH (ref 0–5)
Specific Gravity, Urine: 1.018 (ref 1.005–1.030)
WBC, UA: >50 WBC/hpf — ABNORMAL HIGH (ref 0–5)
pH: 6 (ref 5.0–8.0)

## 2019-05-23 LAB — I-STAT BETA HCG BLOOD, ED (MC, WL, AP ONLY): I-stat hCG, quantitative: <5 m[IU]/mL (ref <5)

## 2019-05-23 MED ORDER — KETOROLAC TROMETHAMINE 60 MG/2ML IM SOLN
60.0000 mg | Freq: Once | INTRAMUSCULAR | Status: AC
  Administered 2019-05-23: 03:00:00 60 mg via INTRAMUSCULAR
  Filled 2019-05-23: qty 2

## 2019-05-23 MED ORDER — CEPHALEXIN 500 MG PO CAPS
500.0000 mg | ORAL_CAPSULE | ORAL | Status: AC
  Administered 2019-05-23: 500 mg via ORAL
  Filled 2019-05-23: qty 1

## 2019-05-23 MED ORDER — NAPROXEN 375 MG PO TABS: 375.0000 mg | ORAL_TABLET | Freq: Two times a day (BID) | ORAL | 0 refills | Status: AC

## 2019-05-23 MED ORDER — CEPHALEXIN 500 MG PO CAPS: ORAL_CAPSULE | ORAL | 0 refills | Status: AC

## 2019-05-23 NOTE — ED Notes (Signed)
2nd urine sample obtained, sample was walked down to lab and handed to a technician.

## 2019-05-23 NOTE — ED Notes (Addendum)
Lab called d/t UA not in process. Per Lab, they do not have a urine on this pt. Urine was sent to lab via tube station.

## 2019-05-23 NOTE — ED Provider Notes (Signed)
[facility]-EMERGENCY DEPT Provider Note   CSN: [id number] Arrival date & time: 05/22/19  1616     History   Chief Complaint Chief Complaint  Patient presents with  . Stint Removal    HPI Monica Boyd is a 43 y.o. female.     The history is provided by the patient.  Illness Location:  Right ureter Quality:  Stent  Severity:  Mild Onset quality:  Gradual Timing:  Constant Progression:  Unchanged Chronicity:  Chronic Context:  Had a stent placed for ureteral stone. Patient reports stent was placed in August at [facility] Relieved by:  Nothing Worsened by:  Nothing  Ineffective treatments:  None tried Associated symptoms: no abdominal pain, no chest pain, no congestion, no cough, no diarrhea, no ear pain, no fatigue, no fever, no headaches, no loss of consciousness, no myalgias, no nausea, no rash, no rhinorrhea, no shortness of breath, no sore throat, no vomiting and no wheezing   Risk factors:  Repeated stones   Past Medical History:  Diagnosis Date  . Blood transfusion without reported diagnosis    no reaction per patient   . HTN (hypertension)   . Kidney stone   . PONV (postoperative nausea and vomiting)     Patient Active Problem List   Diagnosis Date Noted  . Nephrolithiasis 10/19/2018    Past Surgical History:  Procedure Laterality Date  . ABDOMINAL HYSTERECTOMY    . APPENDECTOMY    . CESAREAN SECTION    . IR URETERAL STENT RIGHT NEW ACCESS W/O SEP NEPHROSTOMY CATH  10/19/2018  . NEPHROLITHOTOMY Right 10/19/2018   Procedure: NEPHROLITHOTOMY PERCUTANEOUS;  Surgeon: Jerilee Field, MD;  Location: [facility];  Service: Urology;  Laterality: Right;     OB History   No obstetric history on file.      Home Medications    Prior to Admission medications   Medication Sig Start Date End Date Taking? Authorizing Provider  acetaminophen (TYLENOL) 500 MG tablet Take 500 mg by mouth every 6 (six) hours as needed for moderate pain.   Yes [provider]  ibuprofen (ADVIL,MOTRIN) 200 MG tablet Take 400 mg by mouth every 8 (eight) hours as needed for moderate pain.    Yes [provider]  HYDROcodone-acetaminophen (NORCO) 5-325 MG tablet Take 1 tablet by mouth every 6 (six) hours as needed for moderate pain. Patient not taking: Reported on 05/23/2019 10/19/18   Lucretia Roers, Case M, MD  ondansetron (ZOFRAN) 4 MG tablet Take 1 tablet (4 mg total) by mouth daily as needed for nausea or vomiting. Patient not taking: Reported on 05/23/2019 10/19/18 10/19/19  Lucretia Roers, Case M, MD  oxybutynin (DITROPAN XL) 5 MG 24 hr tablet Take 1 tablet (5 mg total) by mouth at bedtime. Patient not taking: Reported on 05/23/2019 10/19/18   Lucretia Roers, Case M, MD  tamsulosin (FLOMAX) 0.4 MG CAPS capsule Take 1 capsule (0.4 mg total) by mouth daily after supper. Patient not taking: Reported on 05/23/2019 10/19/18   Lucretia Roers Case M, MD    Family History No family history on file.  Social History Social History   Tobacco Use  . Smoking status: Never Smoker  . Smokeless tobacco: Never Used  Substance Use Topics  . Alcohol use: Never    Frequency: Never  . Drug use: Never     Allergies   Patient has no known allergies.   Review of Systems Review of Systems  Constitutional: Negative for fatigue and fever.  HENT: Negative for congestion, ear  pain, rhinorrhea and sore throat.   Eyes: Negative for visual disturbance.  Respiratory: Negative for cough, shortness of breath and wheezing.   Cardiovascular: Negative for chest pain.  Gastrointestinal: Negative for abdominal pain, diarrhea, nausea and vomiting.  Genitourinary: Negative for difficulty urinating, dysuria and flank pain.  Musculoskeletal: Negative for myalgias.  Skin: Negative for rash.  Neurological: Negative for loss of consciousness, weakness and headaches.  Psychiatric/Behavioral: Negative for agitation.     Physical Exam Updated Vital Signs BP (!) 151/98   Pulse (!) 58   Temp 98.8 F (37.1  C) (Oral)   Resp 18   SpO2 100%   Physical Exam Vitals signs and nursing note reviewed.  Constitutional:      General: She is not in acute distress.    Appearance: She is not toxic-appearing.  HENT:     Head: Normocephalic and atraumatic.     Nose: Nose normal.  Eyes:     Conjunctiva/sclera: Conjunctivae normal.     Pupils: Pupils are equal, round, and reactive to light.  Neck:     Musculoskeletal: Normal range of motion and neck supple.  Cardiovascular:     Rate and Rhythm: Normal rate and regular rhythm.     Pulses: Normal pulses.     Heart sounds: Normal heart sounds.  Pulmonary:     Effort: Pulmonary effort is normal. No respiratory distress.     Breath sounds: Normal breath sounds. No wheezing or rales.  Abdominal:     General: Abdomen is flat. Bowel sounds are normal.     Tenderness: There is no abdominal tenderness. There is no guarding or rebound.  Musculoskeletal: Normal range of motion.  Skin:    General: Skin is warm and dry.     Capillary Refill: Capillary refill takes less than 2 seconds.  Neurological:     General: No focal deficit present.     Mental Status: She is alert and oriented to person, place, and time.     Deep Tendon Reflexes: Reflexes normal.  Psychiatric:        Mood and Affect: Mood normal.        Behavior: Behavior normal.      ED Treatments / Results  Labs (all labs ordered are listed, but only abnormal results are displayed) Results for orders placed or performed during the hospital encounter of 05/23/19  Urinalysis, Routine w reflex microscopic  Result Value Ref Range   Color, Urine YELLOW YELLOW   APPearance CLOUDY (A) CLEAR   Specific Gravity, Urine 1.018 1.005 - 1.030   pH 6.0 5.0 - 8.0   Glucose, UA NEGATIVE NEGATIVE mg/dL   Hgb urine dipstick LARGE (A) NEGATIVE   Bilirubin Urine NEGATIVE NEGATIVE   Ketones, ur NEGATIVE NEGATIVE mg/dL   Protein, ur 725100 (A) NEGATIVE mg/dL   Nitrite NEGATIVE NEGATIVE   Leukocytes,Ua LARGE  (A) NEGATIVE   RBC / HPF >50 (H) 0 - 5 RBC/hpf   WBC, UA >50 (H) 0 - 5 WBC/hpf   Bacteria, UA MANY (A) NONE SEEN   Squamous Epithelial / LPF 21-50 0 - 5   WBC Clumps PRESENT    Mucus PRESENT   I-stat chem 8, ED ([facility])  Result Value Ref Range   Sodium 142 135 - 145 mmol/L   Potassium 3.2 (L) 3.5 - 5.1 mmol/L   Chloride 105 98 - 111 mmol/L   BUN 16 6 - 20 mg/dL   Creatinine, Ser 3.660.90 0.44 - 1.00 mg/dL  Glucose, Bld 100 (H) 70 - 99 mg/dL   Calcium, Ion 1.611.24 0.961.15 - 1.40 mmol/L   TCO2 26 22 - 32 mmol/L   Hemoglobin 10.9 (L) 12.0 - 15.0 g/dL   HCT 04.532.0 (L) 40.936.0 - 81.146.0 %  I-Stat Beta hCG blood, ED ([facility], [facility], [facility] only)  Result Value Ref Range   I-stat hCG, quantitative <5.0 <5 mIU/mL   Comment 3           Dg Abdomen 1 View  Result Date: 05/23/2019 CLINICAL DATA:  History of recent stent placement EXAM: ABDOMEN - 1 VIEW COMPARISON:  None. FINDINGS: Scattered large and small bowel gas is noted. No abnormal mass or abnormal calcifications are seen. Right ureteral stent is noted in satisfactory position. No definitive ureteral stones are seen. IMPRESSION: Right ureteral stent in satisfactory position. Electronically Signed   By: Alcide CleverMark  Lukens M.D.   On: 05/23/2019 02:07    Radiology Dg Abdomen 1 View  Result Date: 05/23/2019 CLINICAL DATA:  History of recent stent placement EXAM: ABDOMEN - 1 VIEW COMPARISON:  None. FINDINGS: Scattered large and small bowel gas is noted. No abnormal mass or abnormal calcifications are seen. Right ureteral stent is noted in satisfactory position. No definitive ureteral stones are seen. IMPRESSION: Right ureteral stent in satisfactory position. Electronically Signed   By: Alcide CleverMark  Lukens M.D.   On: 05/23/2019 02:07    Procedures Procedures (including critical care time)  Medications Ordered in ED Medications  ketorolac (TORADOL) injection 60 mg (60 mg Intramuscular Given 05/23/19 0231)     Case d/w Dr. Sherron MondayMacDiarmid, of urology.  Follow up in the  office as an outpatient to discuss removal of stent  Will treat with antibiotics.  Based on notes, the stent has been in place since February not August as the patient stated.  The patient became belligerent initially as she owes the doctors who placed it money and demanded it be done here.    EDP went to speak with the patient with nurse Katie present.  EDP states that she spoke with urology and they stated that the removal was a procedure to be done by their team as an outpatient.  We will treat her UTI with antibiotics and provide the name of [facility] for follow up. There are no signs of sepsis or dehydration, patient is asymptomatic from her UTI.  The patient has had the stent in place for many month and patient has a normal kidney function.  There is no indication for admission at this time.  Will d/c with 10 days of antibiotic.  She is advised to call urology in the am to schedule a follow up.   Monica Boyd was evaluated in Emergency Department on 05/23/2019 for the symptoms described in the history of present illness. She was evaluated in the context of the global COVID-19 pandemic, which necessitated consideration that the patient might be at risk for infection with the SARS-CoV-2 virus that causes COVID-19. Institutional protocols and algorithms that pertain to the evaluation of patients at risk for COVID-19 are in a state of rapid change based on information released by regulatory bodies including the CDC and federal and state organizations. These policies and algorithms were followed during the patient's care in the ED.   Final Clinical Impressions(s) / ED Diagnoses   Final diagnoses:  Constipation, unspecified constipation type  Ureteral stent retained    Return for intractable cough, coughing up blood,fevers >100.4 unrelieved by medication, shortness of breath, intractable vomiting, chest pain,  shortness of breath, weakness,numbness, changes in speech, facial  asymmetry,abdominal pain, passing out,Inability to tolerate liquids or food, cough, altered mental status or any concerns. No signs of systemic illness or infection. The patient is nontoxic-appearing on exam and vital signs are within normal limits.   I have reviewed the triage vital signs and the nursing notes. Pertinent labs &imaging results that were available during my care of the patient were reviewed by me and considered in my medical decision making (see chart for details).After history, exam, and medical workup I feel the patient has beenappropriately medically screened and is safe for discharge home. Pertinent diagnoses were discussed with the patient. Patient was given return precautions.    Hadlei Stitt, MD 05/23/19 4967

## 2019-05-23 NOTE — ED Notes (Signed)
Pt ambulatory with a steady gait from triage to room, then to the bathroom.

## 2019-05-23 NOTE — ED Notes (Signed)
Pt transported to Xray. 

## 2019-05-24 NOTE — Telephone Encounter (Signed)
Patient is requesting a referral be faxed to [facility]. She had a stent placed,but has moved out of town. She is trying to have it taken out in [place]. The fax number is [phone]. If you have any questions she can be reached @ [phone].

## 2019-05-27 ENCOUNTER — Encounter: Attending: Urology

## 2019-06-06 NOTE — Telephone Encounter (Signed)
I spoke with patient she has had her stent taken out in [place]  No referral was needed for that appointment and she will not be following up here.  She was only working here temporally and has moved back home.

## 2019-06-06 NOTE — Telephone Encounter (Signed)
-----   Message from [facility] sent at 06/06/2019 10:49 AM EDT -----  This was noted on 05/24/2019-I'm not sure if there was any response to her. I just wanted to make sure before pre-charting.      Patient is requesting a referral be faxed to [facility]. She had a stent placed,but has moved out of town. She is trying to have it taken out in [place]. The fax number is [phone]. If you have any questions she can be reached @ [phone].

## 2019-06-10 ENCOUNTER — Encounter: Attending: Urology

## 2019-06-14 ENCOUNTER — Encounter (HOSPITAL_COMMUNITY): Payer: Self-pay

## 2019-06-14 ENCOUNTER — Emergency Department (HOSPITAL_COMMUNITY)
Admission: EM | Admit: 2019-06-14 | Discharge: 2019-06-14 | Disposition: A | Discharge status: Home / Self Care | Payer: Self-pay | Attending: Emergency Medicine | Admitting: Emergency Medicine

## 2019-06-14 ENCOUNTER — Emergency Department (HOSPITAL_COMMUNITY): Payer: Self-pay

## 2019-06-14 ENCOUNTER — Other Ambulatory Visit: Payer: Self-pay

## 2019-06-14 DIAGNOSIS — N200 Calculus of kidney: Secondary | ICD-10-CM | POA: Insufficient documentation

## 2019-06-14 DIAGNOSIS — I10 Essential (primary) hypertension: Secondary | ICD-10-CM | POA: Insufficient documentation

## 2019-06-14 DIAGNOSIS — Z20828 Contact with and (suspected) exposure to other viral communicable diseases: Secondary | ICD-10-CM | POA: Insufficient documentation

## 2019-06-14 LAB — CBC WITH DIFFERENTIAL/PLATELET
Abs Immature Granulocytes: 0.04 10*3/uL (ref 0.00–0.07)
Basophils Absolute: 0 10*3/uL (ref 0.0–0.1)
Basophils Relative: 0 %
Eosinophils Absolute: 0 10*3/uL (ref 0.0–0.5)
Eosinophils Relative: 0 %
HCT: 42.4 % (ref 36.0–46.0)
Hemoglobin: 13.4 g/dL (ref 12.0–15.0)
Immature Granulocytes: 0 %
Lymphocytes Relative: 4 %
Lymphs Abs: 0.5 10*3/uL — ABNORMAL LOW (ref 0.7–4.0)
MCH: 28.6 pg (ref 26.0–34.0)
MCHC: 31.6 g/dL (ref 30.0–36.0)
MCV: 90.4 fL (ref 80.0–100.0)
Monocytes Absolute: 0.7 10*3/uL (ref 0.1–1.0)
Monocytes Relative: 5 %
Neutro Abs: 11.2 10*3/uL — ABNORMAL HIGH (ref 1.7–7.7)
Neutrophils Relative %: 91 %
Platelets: 232 10*3/uL (ref 150–400)
RBC: 4.69 MIL/uL (ref 3.87–5.11)
RDW: 13.2 % (ref 11.5–15.5)
WBC: 12.5 10*3/uL — ABNORMAL HIGH (ref 4.0–10.5)
nRBC: 0 % (ref 0.0–0.2)

## 2019-06-14 LAB — COMPREHENSIVE METABOLIC PANEL
ALT: 14 U/L (ref 0–44)
AST: 19 U/L (ref 15–41)
Albumin: 4.5 g/dL (ref 3.5–5.0)
Alkaline Phosphatase: 86 U/L (ref 38–126)
Anion gap: 11 (ref 5–15)
BUN: 24 mg/dL — ABNORMAL HIGH (ref 6–20)
CO2: 26 mmol/L (ref 22–32)
Calcium: 9.7 mg/dL (ref 8.9–10.3)
Chloride: 103 mmol/L (ref 98–111)
Creatinine, Ser: 1.23 mg/dL — ABNORMAL HIGH (ref 0.44–1.00)
GFR calc Af Amer: >60 mL/min (ref >60)
GFR calc non Af Amer: 53 mL/min — ABNORMAL LOW (ref >60)
Glucose, Bld: 81 mg/dL (ref 70–99)
Potassium: 3.2 mmol/L — ABNORMAL LOW (ref 3.5–5.1)
Sodium: 140 mmol/L (ref 135–145)
Total Bilirubin: 0.9 mg/dL (ref 0.3–1.2)
Total Protein: 7.9 g/dL (ref 6.5–8.1)

## 2019-06-14 LAB — URINALYSIS, ROUTINE W REFLEX MICROSCOPIC
Bacteria, UA: NONE SEEN
Bilirubin Urine: NEGATIVE
Glucose, UA: NEGATIVE mg/dL
Hgb urine dipstick: NEGATIVE
Ketones, ur: NEGATIVE mg/dL
Nitrite: NEGATIVE
Protein, ur: 30 mg/dL — AB
Specific Gravity, Urine: 1.027 (ref 1.005–1.030)
pH: 5 (ref 5.0–8.0)

## 2019-06-14 LAB — POC URINE PREG, ED: Preg Test, Ur: NEGATIVE

## 2019-06-14 LAB — SARS CORONAVIRUS 2 (TAT 6-24 HRS): SARS Coronavirus 2: NEGATIVE

## 2019-06-14 MED ORDER — SODIUM CHLORIDE 0.9 % IV BOLUS
500.0000 mL | Freq: Once | INTRAVENOUS | Status: AC
  Administered 2019-06-14: 14:00:00 500 mL via INTRAVENOUS

## 2019-06-14 MED ORDER — KETOROLAC TROMETHAMINE 15 MG/ML IJ SOLN
15.0000 mg | Freq: Once | INTRAMUSCULAR | Status: AC
  Administered 2019-06-14: 15 mg via INTRAVENOUS
  Filled 2019-06-14: qty 1

## 2019-06-14 MED ORDER — HYDROCODONE-ACETAMINOPHEN 5-325 MG PO TABS: 1.0000 | ORAL_TABLET | Freq: Three times a day (TID) | ORAL | 0 refills | Status: AC | PRN

## 2019-06-14 MED ORDER — OXYCODONE-ACETAMINOPHEN 5-325 MG PO TABS
1.0000 | ORAL_TABLET | Freq: Once | ORAL | Status: AC
  Administered 2019-06-14: 14:00:00 1 via ORAL
  Filled 2019-06-14: qty 1

## 2019-06-14 MED ORDER — KETOROLAC TROMETHAMINE 15 MG/ML IJ SOLN
15.0000 mg | Freq: Once | INTRAMUSCULAR | Status: AC
  Administered 2019-06-14: 14:00:00 15 mg via INTRAVENOUS
  Filled 2019-06-14: qty 1

## 2019-06-14 MED ORDER — ONDANSETRON 8 MG PO TBDP: 8.0000 mg | ORAL_TABLET | Freq: Three times a day (TID) | ORAL | 0 refills | Status: AC | PRN

## 2019-06-14 MED ORDER — ONDANSETRON 8 MG PO TBDP
8.0000 mg | ORAL_TABLET | Freq: Once | ORAL | Status: AC
  Administered 2019-06-14: 14:00:00 8 mg via ORAL
  Filled 2019-06-14: qty 1

## 2019-06-14 NOTE — ED Triage Notes (Signed)
Patient c/o bilateral flank pain that radiates into the right lower abdomen and nausea x 4 days. Patient states she had a right stent removed a week ago and has been on antibiotics.

## 2019-06-14 NOTE — ED Provider Notes (Signed)
Patient awake, alert, states that she feels better. She has [facility] follow-up.  No hemodynamic stability, patient discharged in stable condition.   Carmin Muskrat, MD 06/14/19 1747

## 2019-06-14 NOTE — Discharge Instructions (Signed)
We saw you in the ER for the abdominal pain. °Our results indicate that you have a kidney stone. °We were able to get your pain is relative control, and we can safely send you home. ° °Take the meds prescribed. °Set up an appointment with the Urologist. °If the pain is unbearable, you start having fevers, chills, and are unable to keep any meds down - then return to the ER. °  °

## 2019-06-14 NOTE — ED Triage Notes (Signed)
Patient from home, ambulatory, and a&ox4. Pt has hx of kidney stones. Symptoms started 4 days ago and have progressively gotten worse.

## 2019-06-24 NOTE — ED Provider Notes (Signed)
[place] DEPT Provider Note   CSN: [id number] Arrival date & time: 06/14/19  1021     History   Chief Complaint Chief Complaint  Patient presents with   Back Pain   Abdominal Pain   Chills    HPI Monica Boyd is a 43 y.o. female.     HPI 43 y/o female comes in with cc of back pain and abd pain. She has hx of HTN and kidney stones. Pt reports 4 days of constant back pain that is worsening. The pain is bilateral and radiates to the RLQ with associated nausea. No periods. NO bleeding or uti like symptoms. She has hx of kidney stones and had stent removed few weeks back and was on antibiotics until around 10 days ago.   Past Medical History:  Diagnosis Date   Blood transfusion without reported diagnosis    no reaction per patient    HTN (hypertension)    Kidney stone    PONV (postoperative nausea and vomiting)     Patient Active Problem List   Diagnosis Date Noted   Nephrolithiasis 10/19/2018    Past Surgical History:  Procedure Laterality Date   ABDOMINAL HYSTERECTOMY     APPENDECTOMY     CESAREAN SECTION     IR URETERAL STENT RIGHT NEW ACCESS W/O SEP NEPHROSTOMY CATH  10/19/2018   NEPHROLITHOTOMY Right 10/19/2018   Procedure: NEPHROLITHOTOMY PERCUTANEOUS;  Surgeon: Festus Aloe, MD;  Location: [facility];  Service: Urology;  Laterality: Right;     OB History   No obstetric history on file.      Home Medications    Prior to Admission medications   Medication Sig Start Date End Date Taking? Authorizing Provider  acetaminophen (TYLENOL) 500 MG tablet Take 500 mg by mouth every 6 (six) hours as needed for moderate pain.   Yes [provider]  ibuprofen (ADVIL,MOTRIN) 200 MG tablet Take 400 mg by mouth every 8 (eight) hours as needed for moderate pain.    Yes [provider]  cephALEXin (KEFLEX) 500 MG capsule 1 caps po bid x10 days Patient not taking: Reported on 06/14/2019 05/23/19   Palumbo,  April, MD  HYDROcodone-acetaminophen (NORCO) 5-325 MG tablet Take 1 tablet by mouth every 6 (six) hours as needed for moderate pain. Patient not taking: Reported on 05/23/2019 10/19/18   Clydene Laming, Case M, MD  naproxen (NAPROSYN) 375 MG tablet Take 1 tablet (375 mg total) by mouth 2 (two) times daily with a meal. Patient not taking: Reported on 06/14/2019 05/23/19   Palumbo, April, MD  ondansetron (ZOFRAN ODT) 8 MG disintegrating tablet Take 1 tablet (8 mg total) by mouth every 8 (eight) hours as needed for nausea. 06/14/19   Varney Biles, MD  ondansetron (ZOFRAN) 4 MG tablet Take 1 tablet (4 mg total) by mouth daily as needed for nausea or vomiting. Patient not taking: Reported on 05/23/2019 10/19/18 10/19/19  Clydene Laming, Case M, MD  oxybutynin (DITROPAN XL) 5 MG 24 hr tablet Take 1 tablet (5 mg total) by mouth at bedtime. Patient not taking: Reported on 05/23/2019 10/19/18   Clydene Laming, Case M, MD  tamsulosin (FLOMAX) 0.4 MG CAPS capsule Take 1 capsule (0.4 mg total) by mouth daily after supper. Patient not taking: Reported on 05/23/2019 10/19/18   Clydene Laming Case M, MD    Family History History reviewed. No pertinent family history.  Social History Social History   Tobacco Use   Smoking status: Never Smoker   Smokeless tobacco: Never Used  Substance Use Topics   Alcohol use: Never    Frequency: Never   Drug use: Never     Allergies   Patient has no known allergies.   Review of Systems Review of Systems  Constitutional: Positive for activity change and chills. Negative for diaphoresis.  Respiratory: Negative for cough.   Cardiovascular: Negative for chest pain.  Gastrointestinal: Positive for abdominal pain and nausea. Negative for abdominal distention, blood in stool and vomiting.  Genitourinary: Positive for flank pain. Negative for dysuria and hematuria.  Musculoskeletal: Negative for neck pain.  Skin: Negative for color change.  Allergic/Immunologic: Positive for immunocompromised state.    Neurological: Negative for speech difficulty.  Hematological: Bruises/bleeds easily.  Psychiatric/Behavioral: Negative for confusion.  All other systems reviewed and are negative.    Physical Exam Updated Vital Signs BP (!) 155/100    Pulse (!) 101    Temp 99.3 F (37.4 C) (Oral)    Resp 16    Ht 5\' 7"  (1.702 m)    Wt 81.6 kg    SpO2 97%    BMI 28.19 kg/m   Physical Exam Vitals signs and nursing note reviewed.  Constitutional:      Appearance: She is well-developed.  HENT:     Head: Normocephalic and atraumatic.  Neck:     Musculoskeletal: Normal range of motion and neck supple.  Cardiovascular:     Rate and Rhythm: Normal rate.  Pulmonary:     Effort: Pulmonary effort is normal.  Abdominal:     General: Bowel sounds are normal.     Tenderness: There is abdominal tenderness in the right lower quadrant, suprapubic area and left lower quadrant.  Skin:    General: Skin is warm and dry.  Neurological:     Mental Status: She is alert and oriented to person, place, and time.      ED Treatments / Results  Labs (all labs ordered are listed, but only abnormal results are displayed) Labs Reviewed  URINALYSIS, ROUTINE W REFLEX MICROSCOPIC - Abnormal; Notable for the following components:      Result Value   APPearance HAZY (*)    Protein, ur 30 (*)    Leukocytes,Ua TRACE (*)    All other components within normal limits  CBC WITH DIFFERENTIAL/PLATELET - Abnormal; Notable for the following components:   WBC 12.5 (*)    Neutro Abs 11.2 (*)    Lymphs Abs 0.5 (*)    All other components within normal limits  COMPREHENSIVE METABOLIC PANEL - Abnormal; Notable for the following components:   Potassium 3.2 (*)    BUN 24 (*)    Creatinine, Ser 1.23 (*)    GFR calc non Af Amer 53 (*)    All other components within normal limits  SARS CORONAVIRUS 2 (TAT 6-24 HRS)  POC URINE PREG, ED    EKG None  Radiology No results found.  Procedures Procedures (including critical care  time)  Medications Ordered in ED Medications  ondansetron (ZOFRAN-ODT) disintegrating tablet 8 mg (8 mg Oral Given 06/14/19 1425)  oxyCODONE-acetaminophen (PERCOCET/ROXICET) 5-325 MG per tablet 1 tablet (1 tablet Oral Given 06/14/19 1425)  ketorolac (TORADOL) 15 MG/ML injection 15 mg (15 mg Intravenous Given 06/14/19 1424)  sodium chloride 0.9 % bolus 500 mL (0 mLs Intravenous Stopped 06/14/19 1550)  ketorolac (TORADOL) 15 MG/ML injection 15 mg (15 mg Intravenous Given 06/14/19 1641)     Initial Impression / Assessment and Plan / ED Course  I have reviewed the triage vital signs  and the nursing notes.  Pertinent labs & imaging results that were available during my care of the patient were reviewed by me and considered in my medical decision making (see chart for details).        Pt comes in with cc of constant back pain that is worsening. + nausea and chills. No uti like symptoms. She had a recent urologic procedure. CT ordered to confirm the dx of renal stones. Other possibilities include pyelonephritis/perf viscus and ovarian cyst.  Reassessment: The patient appears reasonably screened and/or stabilized for discharge and I doubt any other medical condition or other [facility] requiring further screening, evaluation, or treatment in the ED at this time prior to discharge.   Results from the ER workup discussed with the patient face to face and all questions answered to the best of my ability.  She is getting oral challenge and her care will be signed out to incoming staff.   Final Clinical Impressions(s) / ED Diagnoses   Final diagnoses:  Nephrolithiasis    ED Discharge Orders         Ordered    HYDROcodone-acetaminophen (NORCO/VICODIN) 5-325 MG tablet  Every 8 hours PRN     06/14/19 1627    ondansetron (ZOFRAN ODT) 8 MG disintegrating tablet  Every 8 hours PRN     06/14/19 1627           Derwood Kaplan, MD 06/24/19 1749

## 2019-11-06 IMAGING — CT CT RENAL STONE PROTOCOL
2 of 4 series · 16 of 46 positions shown, 18 images · non-contrast
Comparison: CT the abdomen and pelvis 08/15/2018.

CLINICAL DATA: 43-year-old female with history of bilateral flank
pain radiating into the right lower abdomen with nausea for the past
4 days.

EXAM:
CT ABDOMEN AND PELVIS WITHOUT CONTRAST
TECHNIQUE: Multidetector CT imaging of the abdomen and pelvis was performed
following the standard protocol without IV contrast.

[Series 2: axial st · axial · 0.81mm/px · z∈[-506,-71]mm · 13 of 99 slices shown, 15 images]
[im 6/99  soft-tissue]
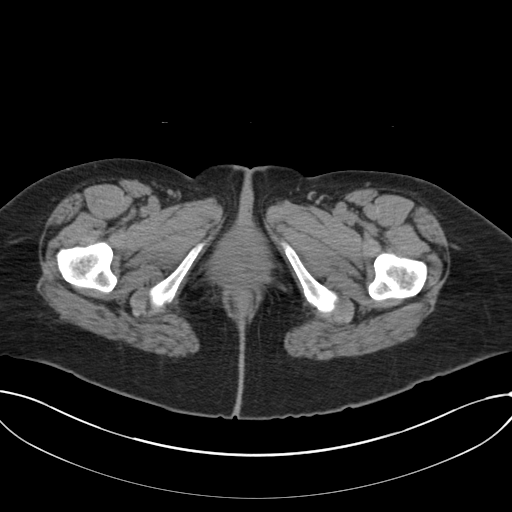
[im 6/99  bone]
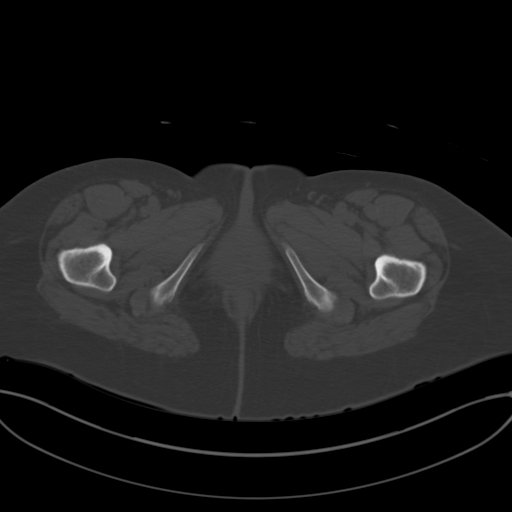
[im 12/99  soft-tissue]
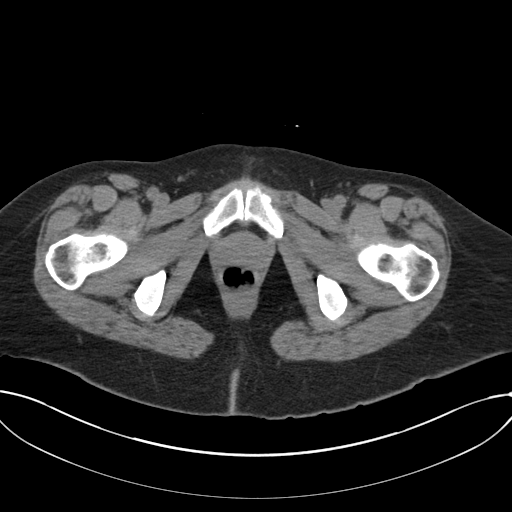
[im 24/99  soft-tissue]
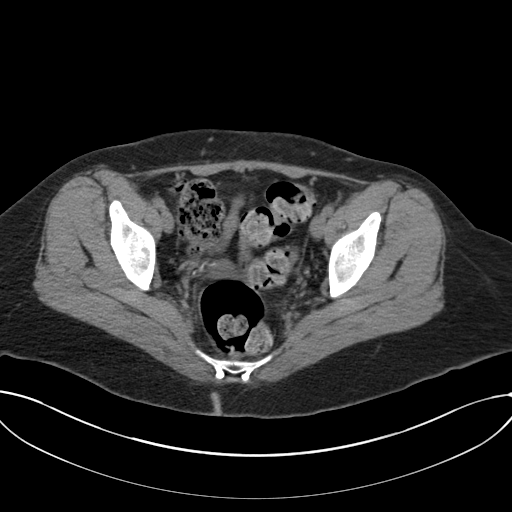
[im 29/99  soft-tissue]
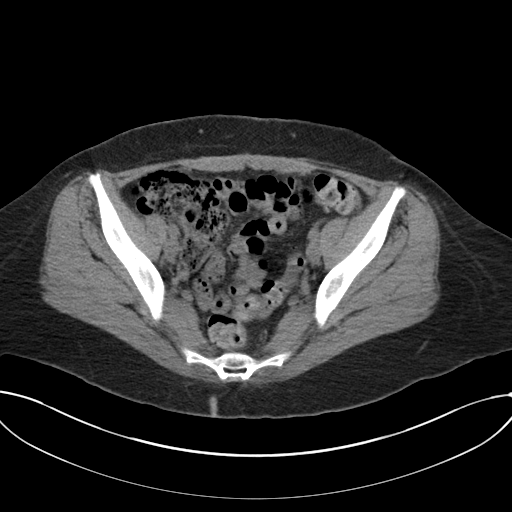
[im 35/99  soft-tissue]
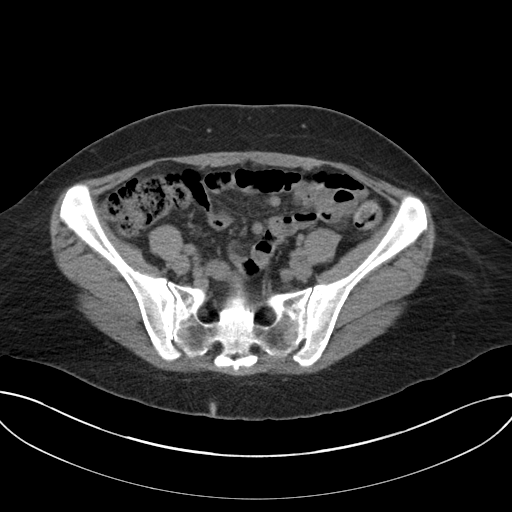
[im 41/99  soft-tissue]
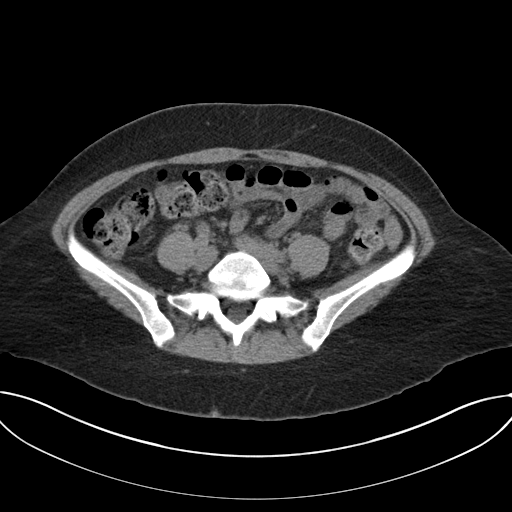
[im 52/99  soft-tissue]
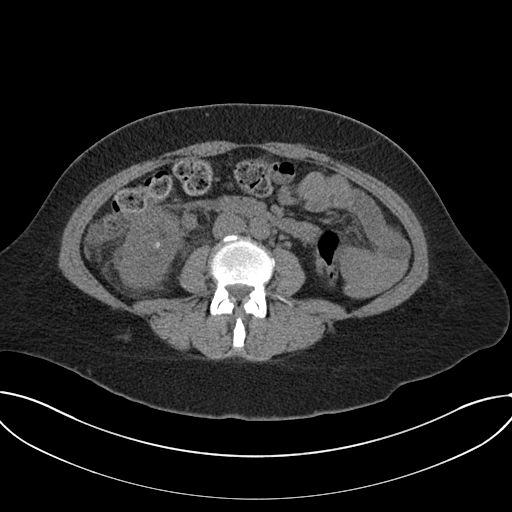
[im 58/99  soft-tissue]
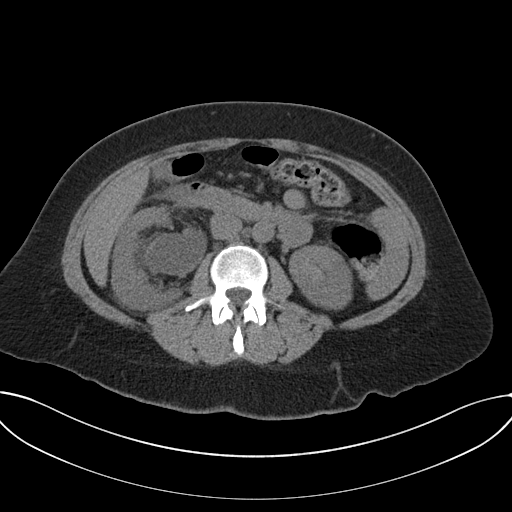
[im 64/99  soft-tissue]
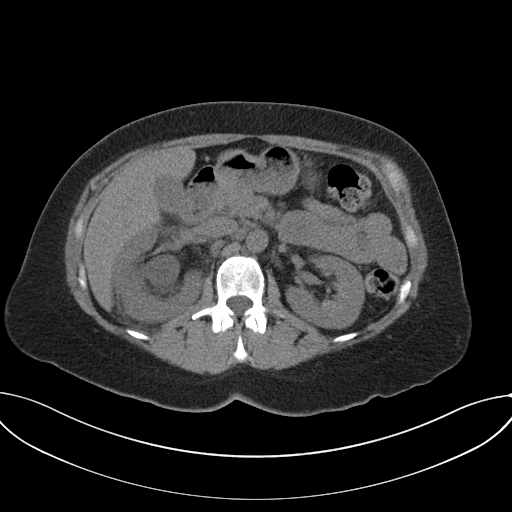
[im 64/99  bone]
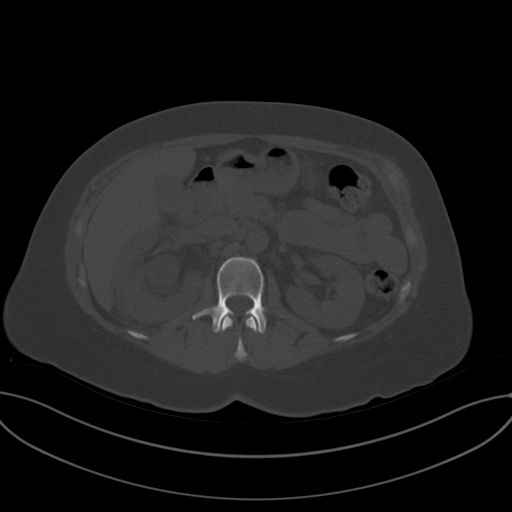
[im 70/99  soft-tissue]
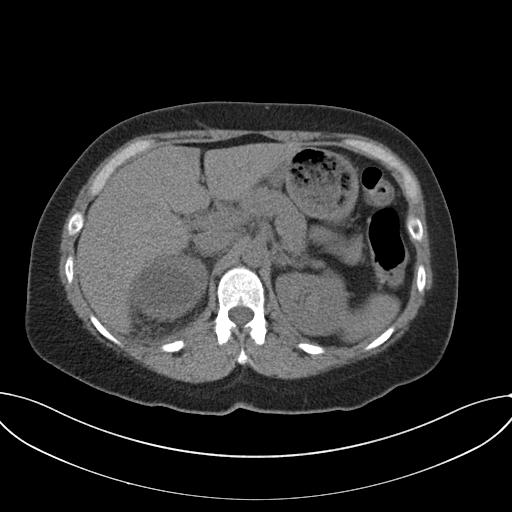
[im 75/99  soft-tissue]
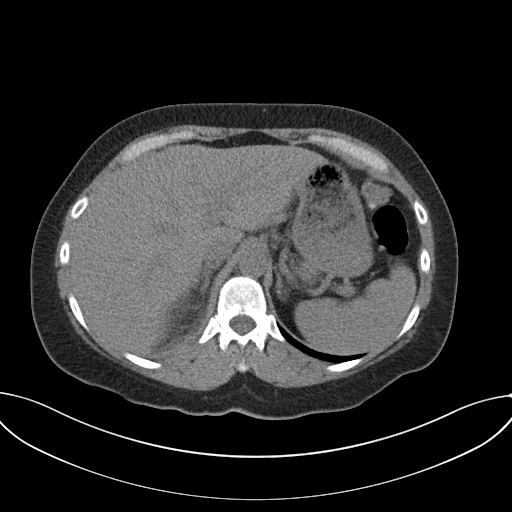
[im 87/99  soft-tissue]
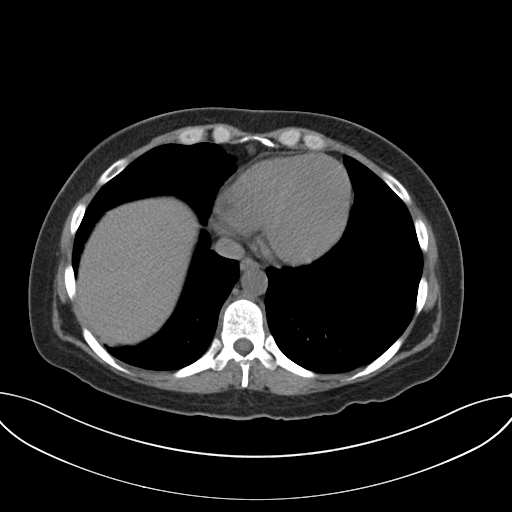
[im 93/99  soft-tissue]
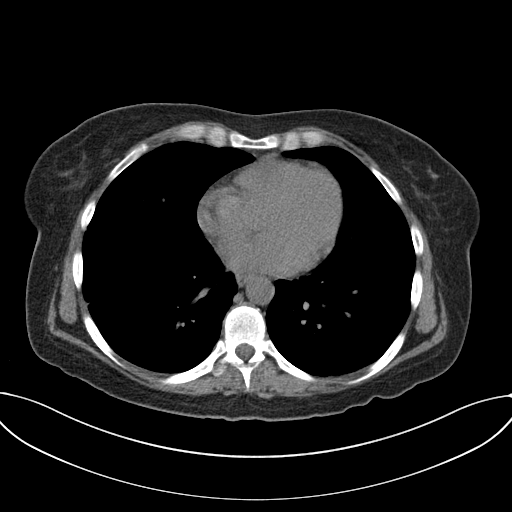

[Series 5: coronal · coronal · 0.92mm/px · 3 of 148 slices shown]
[im 50/148  soft-tissue]
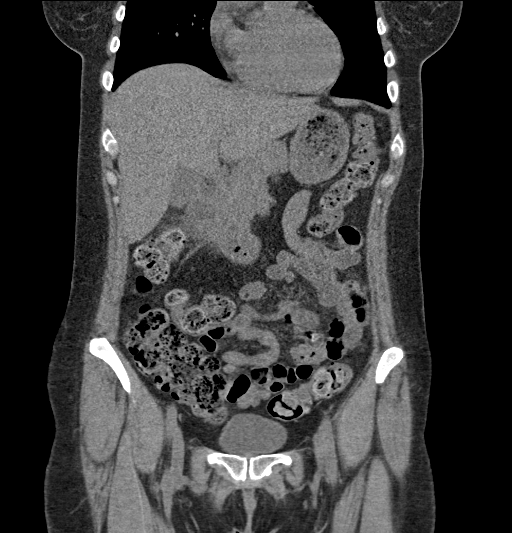
[im 66/148  soft-tissue]
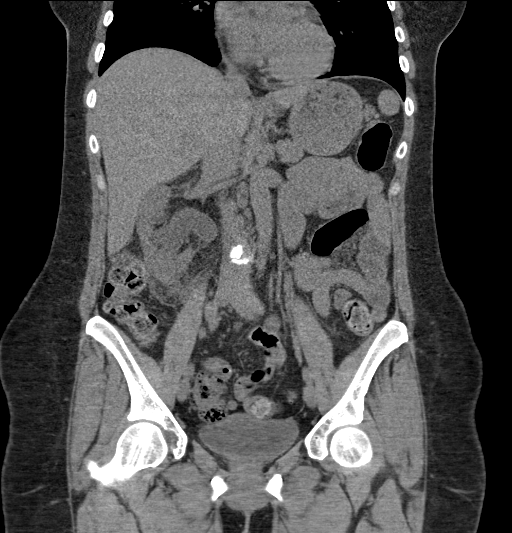
[im 82/148  soft-tissue]
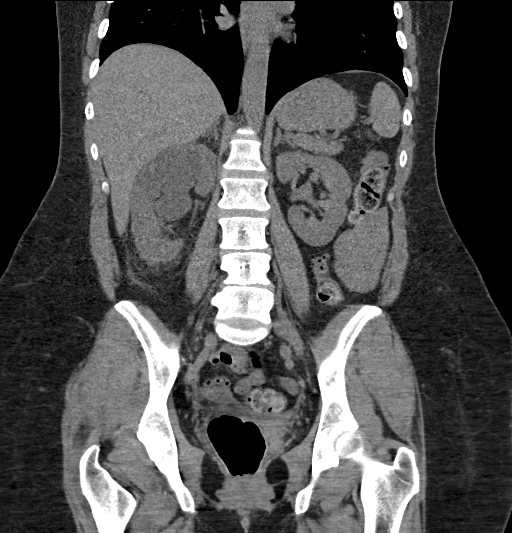

[16 of 46 positions shown; findings below may reference images not displayed]

FINDINGS: Lower chest: Mild linear scarring in the right lower lobe.

Hepatobiliary: No definite suspicious cystic or solid hepatic
lesions are confidently identified on today's noncontrast CT
examination. Tiny partially calcified gallstones lying dependently
in the gallbladder. No surrounding inflammatory changes.

Pancreas: No definite pancreatic mass or peripancreatic fluid
collections or inflammatory changes noted on today's noncontrast CT
examination.

Spleen: Unremarkable.

Adrenals/Urinary Tract: 4 mm faintly calcified nonobstructive
calculus in the lower pole collecting system of the right kidney. In
the proximal third of the right ureter there is an additional
faintly calcified 4 mm calculus (axial image 51 of series 2). This
is associated with severe proximal right hydroureteronephrosis and
extensive perinephric stranding. No additional calculi are
identified in the collecting system of left kidney, along the course
of the left ureter, or within the lumen of the urinary bladder.
Urinary bladder is unremarkable in appearance. Bilateral adrenal
glands are normal in appearance.

Stomach/Bowel: Normal appearance of the stomach. No pathologic
dilatation of small bowel or colon. A few scattered colonic
diverticulae are noted, without surrounding inflammatory changes to
suggest an acute diverticulitis at this time. The appendix is not
confidently identified and may be surgically absent. Regardless,
there are no inflammatory changes noted adjacent to the cecum to
suggest the presence of an acute appendicitis at this time.

Vascular/Lymphatic: No atherosclerotic calcifications in the
abdominal aorta or pelvic vasculature. No lymphadenopathy noted in
the abdomen or pelvis.

Reproductive: Status post hysterectomy. Ovaries are not confidently
identified may be surgically absent or atrophic.

Other: No significant volume of ascites.  No pneumoperitoneum.

Musculoskeletal: There are no aggressive appearing lytic or blastic
lesions noted in the visualized portions of the skeleton.
IMPRESSION: 1. 4 mm obstructive calculus in the proximal third of the right
ureter with severe proximal right hydroureteronephrosis.
2. 4 mm nonobstructive calculus in the lower pole collecting system
of the right kidney.
3. Cholelithiasis without evidence of acute cholecystitis at this
time.
